# Patient Record
Sex: Male | Born: 1975 | Race: Black or African American | Hispanic: No | Marital: Married | Smoking: Never smoker
Health system: Southern US, Community
[De-identification: ages and names within clinical notes are randomized; demographics above are authoritative.]

## PROBLEM LIST (undated history)

## (undated) DIAGNOSIS — K219 Gastro-esophageal reflux disease without esophagitis: Secondary | ICD-10-CM

## (undated) DIAGNOSIS — M5430 Sciatica, unspecified side: Secondary | ICD-10-CM

## (undated) DIAGNOSIS — G4733 Obstructive sleep apnea (adult) (pediatric): Secondary | ICD-10-CM

## (undated) HISTORY — DX: Sciatica, unspecified side: M54.30

## (undated) HISTORY — DX: Gastro-esophageal reflux disease without esophagitis: K21.9

## (undated) HISTORY — DX: Obstructive sleep apnea (adult) (pediatric): G47.33

## (undated) HISTORY — PX: ESOPHAGEAL DILATION: SHX303

---

## 1998-06-28 ENCOUNTER — Emergency Department (HOSPITAL_COMMUNITY): Admission: EM | Admit: 1998-06-28 | Discharge: 1998-06-28 | Payer: Self-pay | Admitting: Emergency Medicine

## 1998-12-03 ENCOUNTER — Emergency Department (HOSPITAL_COMMUNITY): Admission: EM | Admit: 1998-12-03 | Discharge: 1998-12-03 | Payer: Self-pay | Admitting: Emergency Medicine

## 1998-12-03 ENCOUNTER — Encounter: Payer: Self-pay | Admitting: Emergency Medicine

## 1999-01-09 ENCOUNTER — Ambulatory Visit (HOSPITAL_COMMUNITY): Admission: RE | Admit: 1999-01-09 | Discharge: 1999-01-09 | Payer: Self-pay | Admitting: Family Medicine

## 1999-01-09 ENCOUNTER — Encounter: Payer: Self-pay | Admitting: Family Medicine

## 1999-01-20 ENCOUNTER — Emergency Department (HOSPITAL_COMMUNITY): Admission: EM | Admit: 1999-01-20 | Discharge: 1999-01-20 | Payer: Self-pay | Admitting: Emergency Medicine

## 1999-01-20 ENCOUNTER — Encounter: Payer: Self-pay | Admitting: Emergency Medicine

## 1999-01-25 ENCOUNTER — Ambulatory Visit (HOSPITAL_COMMUNITY): Admission: RE | Admit: 1999-01-25 | Discharge: 1999-01-25 | Payer: Self-pay | Admitting: Critical Care Medicine

## 1999-01-26 ENCOUNTER — Encounter: Payer: Self-pay | Admitting: Critical Care Medicine

## 1999-01-26 ENCOUNTER — Inpatient Hospital Stay (HOSPITAL_COMMUNITY): Admission: RE | Admit: 1999-01-26 | Discharge: 1999-01-27 | Payer: Self-pay | Admitting: Critical Care Medicine

## 2000-06-18 ENCOUNTER — Encounter: Payer: Self-pay | Admitting: Emergency Medicine

## 2000-06-18 ENCOUNTER — Emergency Department (HOSPITAL_COMMUNITY): Admission: EM | Admit: 2000-06-18 | Discharge: 2000-06-18 | Payer: Self-pay | Admitting: *Deleted

## 2003-08-15 ENCOUNTER — Emergency Department (HOSPITAL_COMMUNITY): Admission: EM | Admit: 2003-08-15 | Discharge: 2003-08-16 | Payer: Self-pay | Admitting: Emergency Medicine

## 2004-06-19 ENCOUNTER — Ambulatory Visit: Payer: Self-pay | Admitting: Family Medicine

## 2004-06-26 ENCOUNTER — Ambulatory Visit: Payer: Self-pay | Admitting: Family Medicine

## 2005-02-12 ENCOUNTER — Ambulatory Visit: Payer: Self-pay | Admitting: Critical Care Medicine

## 2005-03-14 ENCOUNTER — Ambulatory Visit: Payer: Self-pay | Admitting: Internal Medicine

## 2005-06-27 ENCOUNTER — Ambulatory Visit: Payer: Self-pay | Admitting: Family Medicine

## 2006-04-05 ENCOUNTER — Ambulatory Visit: Payer: Self-pay | Admitting: Family Medicine

## 2006-04-23 ENCOUNTER — Ambulatory Visit: Payer: Self-pay | Admitting: Family Medicine

## 2006-07-03 ENCOUNTER — Ambulatory Visit: Payer: Self-pay | Admitting: Family Medicine

## 2006-07-23 ENCOUNTER — Ambulatory Visit: Payer: Self-pay | Admitting: Family Medicine

## 2006-08-09 ENCOUNTER — Ambulatory Visit: Payer: Self-pay | Admitting: Gastroenterology

## 2006-08-22 ENCOUNTER — Ambulatory Visit: Payer: Self-pay | Admitting: Gastroenterology

## 2006-08-22 ENCOUNTER — Encounter (INDEPENDENT_AMBULATORY_CARE_PROVIDER_SITE_OTHER): Payer: Self-pay | Admitting: *Deleted

## 2006-09-10 ENCOUNTER — Encounter: Payer: Self-pay | Admitting: Adult Health

## 2006-09-10 ENCOUNTER — Ambulatory Visit: Payer: Self-pay | Admitting: Internal Medicine

## 2006-09-24 ENCOUNTER — Ambulatory Visit: Payer: Self-pay | Admitting: Gastroenterology

## 2006-11-11 ENCOUNTER — Ambulatory Visit: Payer: Self-pay | Admitting: Critical Care Medicine

## 2006-11-11 ENCOUNTER — Encounter: Payer: Self-pay | Admitting: Internal Medicine

## 2007-05-16 ENCOUNTER — Ambulatory Visit: Payer: Self-pay | Admitting: Family Medicine

## 2007-05-19 DIAGNOSIS — J45909 Unspecified asthma, uncomplicated: Secondary | ICD-10-CM | POA: Insufficient documentation

## 2007-05-20 LAB — CONVERTED CEMR LAB
AST: 22 units/L (ref 0–37)
Albumin: 4.2 g/dL (ref 3.5–5.2)
Alkaline Phosphatase: 110 units/L (ref 39–117)
BUN: 8 mg/dL (ref 6–23)
Basophils Absolute: 0 10*3/uL (ref 0.0–0.1)
Basophils Relative: 0 % (ref 0.0–1.0)
CO2: 33 meq/L — ABNORMAL HIGH (ref 19–32)
Chloride: 105 meq/L (ref 96–112)
Cholesterol: 142 mg/dL (ref 0–200)
Creatinine, Ser: 1.1 mg/dL (ref 0.4–1.5)
HCT: 43.8 % (ref 39.0–52.0)
Hemoglobin: 15.3 g/dL (ref 13.0–17.0)
LDL Cholesterol: 90 mg/dL (ref 0–99)
MCHC: 34.9 g/dL (ref 30.0–36.0)
Monocytes Absolute: 0.4 10*3/uL (ref 0.2–0.7)
Monocytes Relative: 8 % (ref 3.0–11.0)
Neutrophils Relative %: 53.5 % (ref 43.0–77.0)
RBC: 4.86 M/uL (ref 4.22–5.81)
RDW: 12.1 % (ref 11.5–14.6)
Total Bilirubin: 0.9 mg/dL (ref 0.3–1.2)
Total CHOL/HDL Ratio: 3.5
Total Protein: 8.1 g/dL (ref 6.0–8.3)
VLDL: 12 mg/dL (ref 0–40)

## 2007-05-26 ENCOUNTER — Ambulatory Visit: Payer: Self-pay | Admitting: Family Medicine

## 2007-05-26 DIAGNOSIS — K219 Gastro-esophageal reflux disease without esophagitis: Secondary | ICD-10-CM | POA: Insufficient documentation

## 2007-09-26 ENCOUNTER — Ambulatory Visit: Payer: Self-pay | Admitting: Family Medicine

## 2007-11-20 ENCOUNTER — Encounter: Payer: Self-pay | Admitting: Family Medicine

## 2008-01-12 ENCOUNTER — Ambulatory Visit: Payer: Self-pay | Admitting: Critical Care Medicine

## 2008-01-13 ENCOUNTER — Encounter: Payer: Self-pay | Admitting: Critical Care Medicine

## 2008-03-25 ENCOUNTER — Ambulatory Visit: Payer: Self-pay | Admitting: Family Medicine

## 2008-04-29 ENCOUNTER — Encounter: Payer: Self-pay | Admitting: Family Medicine

## 2008-09-27 ENCOUNTER — Ambulatory Visit: Payer: Self-pay | Admitting: Family Medicine

## 2008-09-27 LAB — CONVERTED CEMR LAB
Nitrite: NEGATIVE
Protein, U semiquant: NEGATIVE
Urobilinogen, UA: 0.2
WBC Urine, dipstick: NEGATIVE

## 2008-10-01 LAB — CONVERTED CEMR LAB
ALT: 18 units/L (ref 0–53)
AST: 21 units/L (ref 0–37)
Albumin: 4 g/dL (ref 3.5–5.2)
Alkaline Phosphatase: 92 units/L (ref 39–117)
BUN: 11 mg/dL (ref 6–23)
CO2: 28 meq/L (ref 19–32)
Chloride: 103 meq/L (ref 96–112)
Eosinophils Absolute: 0.3 10*3/uL (ref 0.0–0.7)
Eosinophils Relative: 4.6 % (ref 0.0–5.0)
GFR calc non Af Amer: 91 mL/min
HDL: 52.9 mg/dL (ref >39.0)
LDL Cholesterol: 88 mg/dL (ref 0–99)
Lymphocytes Relative: 29.7 % (ref 12.0–46.0)
MCV: 90.6 fL (ref 78.0–100.0)
Monocytes Relative: 6.7 % (ref 3.0–12.0)
Neutrophils Relative %: 58.8 % (ref 43.0–77.0)
Platelets: 234 10*3/uL (ref 150–400)
Potassium: 4.2 meq/L (ref 3.5–5.1)
Total CHOL/HDL Ratio: 2.9
VLDL: 11 mg/dL (ref 0–40)
WBC: 6 10*3/uL (ref 4.5–10.5)

## 2008-10-04 ENCOUNTER — Ambulatory Visit: Payer: Self-pay | Admitting: Family Medicine

## 2008-10-16 ENCOUNTER — Encounter: Payer: Self-pay | Admitting: Family Medicine

## 2008-11-03 ENCOUNTER — Encounter: Admission: RE | Admit: 2008-11-03 | Discharge: 2008-11-03 | Payer: Self-pay | Admitting: Orthopedic Surgery

## 2009-05-20 ENCOUNTER — Ambulatory Visit: Payer: Self-pay | Admitting: Critical Care Medicine

## 2009-11-03 ENCOUNTER — Encounter: Admission: RE | Admit: 2009-11-03 | Discharge: 2009-11-03 | Payer: Self-pay | Admitting: Orthopedic Surgery

## 2010-03-01 ENCOUNTER — Ambulatory Visit: Payer: Self-pay | Admitting: Family Medicine

## 2010-03-23 ENCOUNTER — Emergency Department (HOSPITAL_COMMUNITY): Admission: EM | Admit: 2010-03-23 | Discharge: 2010-03-23 | Payer: Self-pay | Admitting: Family Medicine

## 2010-08-21 ENCOUNTER — Telehealth (INDEPENDENT_AMBULATORY_CARE_PROVIDER_SITE_OTHER): Payer: Self-pay | Admitting: *Deleted

## 2010-09-09 ENCOUNTER — Encounter: Payer: Self-pay | Admitting: Family Medicine

## 2010-09-12 NOTE — Assessment & Plan Note (Signed)
Summary: hamstring pain//ccm   Vital Signs:  Patient profile:   35 year old male Weight:      191 pounds BP sitting:   120 / 88  (left arm) Cuff size:   regular  Vitals Entered By: Raechel Ache, RN (March 01, 2010 4:16 PM) CC: C/o pain back of R thigh x 10 days.   History of Present Illness: Here for 10 days of pain in the back of the right thigh. No recent trauma that he knows of. Never had this before. Using heat and Advil with mixed results. Still able to work but he is having a lot of pain in the hamstrings.   Allergies: 1)  ! * Pepto Bismal  Past History:  Past Medical History: Reviewed history from 03/25/2008 and no changes required. Asthma, sees Dr. Delford Field GERD  Review of Systems  The patient denies anorexia, fever, weight loss, weight gain, vision loss, decreased hearing, hoarseness, chest pain, syncope, dyspnea on exertion, peripheral edema, prolonged cough, headaches, hemoptysis, abdominal pain, melena, hematochezia, severe indigestion/heartburn, hematuria, incontinence, genital sores, muscle weakness, suspicious skin lesions, transient blindness, difficulty walking, depression, unusual weight change, abnormal bleeding, enlarged lymph nodes, angioedema, breast masses, and testicular masses.    Physical Exam  General:  walks with a slight limp Msk:  his right hamstrings are tender but not swollen. Extension of the right leg with the knee straight is painful, not with the knee bent    Impression & Recommendations:  Problem # 1:  CERUMEN IMPACTION (ICD-380.4)  Complete Medication List: 1)  Advair Diskus 250-50 Mcg/dose Aepb (Fluticasone-salmeterol) .Marland Kitchen.. 1 puff two times a day 2)  Diclofenac Sodium 50 Mg Tbec (Diclofenac sodium) .... Three times a day as needed pain  Patient Instructions: 1)  both were irrigated clear with water. 2)  Please schedule a follow-up appointment as needed .  Prescriptions: DICLOFENAC SODIUM 50 MG TBEC (DICLOFENAC SODIUM) three times  a day as needed pain  #60 x 5   Entered and Authorized by:   Nelwyn Salisbury MD   Signed by:   Nelwyn Salisbury MD on 03/01/2010   Method used:   [facility]  [address] [phone]* (retail[facility]       [address]       [place], [place]  [address]       Ph: [phone]       Fax: [phone]   RxID:   [id number]   Appended Document: hamstring pain//ccm     Allergies: 1)  ! * Pepto Bismal   Impression & Recommendations:  Problem # 1:  STRAIN, THIGH (ICD-843.9)  Complete Medication List: 1)  Advair Diskus 250-50 Mcg/dose Aepb (Fluticasone-salmeterol) .Marland Kitchen.. 1 puff two times a day 2)  Diclofenac Sodium 50 Mg Tbec (Diclofenac sodium) .... Three times a day as needed pain  Patient Instructions: 1)  rest, heat, Diclofenac as needed , and gentle stretches. This will take several weeks to heal.  2)  Please schedule a follow-up appointment as needed .   Appended Document: hamstring pain//ccm please note the original record of a cerumen impaction was incorrect. He actually has a hamstring pull as noted in the APPEND above.

## 2010-09-12 NOTE — Letter (Signed)
Summary: Statement of Medical Necessity/ [facility]  Statement of Medical Necessity/ [facility]   Imported By: Lennie Odor 01/02/2010 16:12:29  _____________________________________________________________________  External Attachment:    Type:   Image     Comment:   External Document

## 2010-09-12 NOTE — Miscellaneous (Signed)
Summary: Injection Record / [facility]    Injection Record / [facility]    Imported By: Lennie Odor 01/02/2010 16:11:03  _____________________________________________________________________  External Attachment:    Type:   Image     Comment:   External Document

## 2010-09-14 ENCOUNTER — Ambulatory Visit (INDEPENDENT_AMBULATORY_CARE_PROVIDER_SITE_OTHER): Payer: BC Managed Care – PPO | Admitting: Critical Care Medicine

## 2010-09-14 ENCOUNTER — Ambulatory Visit: Admit: 2010-09-14 | Payer: Self-pay | Admitting: Critical Care Medicine

## 2010-09-14 ENCOUNTER — Encounter: Payer: Self-pay | Admitting: Critical Care Medicine

## 2010-09-14 DIAGNOSIS — G4733 Obstructive sleep apnea (adult) (pediatric): Secondary | ICD-10-CM

## 2010-09-14 DIAGNOSIS — J45909 Unspecified asthma, uncomplicated: Secondary | ICD-10-CM

## 2010-09-14 NOTE — Progress Notes (Signed)
Summary: prescription  Phone Note Call from Patient   Caller: Patient Call For: dr Delford Field Summary of Call: Patient phoned and scheduled  follow up appointment for 09/14/10 at 12:00 but he does not have enough of his Advir 250 to last and wanted to know if [facility] [address] Patient can be reached [phone] Initial call taken by: Vedia Coffer,  August 21, 2010 3:48 PM  Follow-up for Phone Call        Rx refilled x 1 only- [facility] and advised pt this was done and needs to keep followup with PW for refills.  Follow-up by: Vernie Murders,  August 21, 2010 4:24 PM    Prescriptions: ADVAIR DISKUS 250-50 MCG/DOSE AEPB (FLUTICASONE-SALMETEROL) 1 puff two times a day  #1 x 0   Entered by:   Vernie Murders   Authorized by:   Storm Frisk MD   Signed by:   Vernie Murders on 08/21/2010   Method used:   [facility]  [address] [phone]* (retail)       [address]       [place], [place]  [address]       Ph: [phone]       Fax: [phone]   RxID:   [id number]

## 2010-09-20 NOTE — Assessment & Plan Note (Signed)
Summary: Pulmonary OV   Vital Signs:  Patient profile:   35 year old male Height:      71.5 inches Weight:      194.38 pounds BMI:     26.83 O2 Sat:      96 % on Room air Temp:     98.9 degrees F oral Pulse rate:   84 / minute BP sitting:   122 / 84  (right arm) Cuff size:   regular  Vitals Entered By: Gweneth Dimitri RN (September 14, 2010 11:59 AM)  O2 Flow:  Room air CC: Follow up.  Last seen 05/2009.  Pt states overall breathing is going good - does have SOB "periodically."   Mainly when walking a long distance.  Denies wheezing, chest tightness, cough. Comments Medications reviewed with patient Daytime contact number verified with patient. Gweneth Dimitri RN  September 14, 2010 12:01 PM    Primary Provider/Referring Provider:  Dr. Clent Ridges  CC:  Follow up.  Last seen 05/2009.  Pt states overall breathing is going good - does have SOB "periodically."   Mainly when walking a long distance.  Denies wheezing, chest tightness, and cough..  History of Present Illness: Cole Chen is a 35 year old white male who I have not seen since July 2006.  The patient has history of moderate to severe asthma with significant atopic features.  He was seen by Dr. Maple Hudson for allergy evaluation in August 2006, had an Alpha 1-antitrypsin assay checked which was normal.  He had allergy skin testing done with multiple positive skin tests noted.  He had an IgE level obtained, which was elevated at 278.  He maintains Advair 500/50 one spray twice daily and Prilosec 20 mg daily.  last visit 3/08  The pt exhibits fixed severe airflow obstruction.  He is not requiring a rescue inhaler.  There is no cough.  Allergy season was an issue.  No ED visits or hospital visits in the past year.    May 20, 2009 11:01 AM No changes.  No new issues.   Pt denies any significant sore throat, nasal congestion or excess secretions, fever, chills, sweats, unintended weight loss, pleurtic or exertional chest pain,  orthopnea PND, or leg swelling Pt denies any increase in rescue therapy over baseline, denies waking up needing it or having any early am or nocturnal exacerbations of coughing/wheezing/or dyspnea.  September 14, 2010 12:05 PM not seen since 10/10 pt is noting snoring,  no new symptoms. no excess mucus. Pt with ongoing hypersomnolence.  See sleep eval form. Pt denies any significant sore throat, nasal congestion or excess secretions, fever, chills, sweats, unintended weight loss, pleurtic or exertional chest pain, orthopnea PND, or leg swelling Pt denies any increase in rescue therapy over baseline, denies waking up needing it or having any early am or nocturnal exacerbations of coughing/wheezing/or dyspnea.   Asthma History    Asthma Control Assessment:    Age range: 12+ years    Symptoms: 0-2 days/week    Nighttime Awakenings: 0-2/month    Interferes w/ normal activity: no limitations    SABA use (not for EIB): 0-2 days/week    ATAQ questionnaire: 0    Exacerbations requiring oral systemic steroids: 0-1/year    Asthma Control Assessment: Well Controlled   Preventive Screening-Counseling & Management  Alcohol-Tobacco     Smoking Status: never  Current Medications (verified): 1)  Advair Diskus 250-50 Mcg/dose Aepb (Fluticasone-Salmeterol) .Marland Kitchen.. 1 Puff Two Times A Day 2)  Diclofenac Sodium  50 Mg Tbec (Diclofenac Sodium) .... Three Times A Day As Needed Pain  Allergies (verified): 1)  ! * Pepto Bismal  Past History:  Past medical, surgical, family and social histories (including risk factors) reviewed, and no changes noted (except as noted below).  Past Medical History: Asthma, sees Dr. Delford Field GERD Sciatic nerve irritation  Past Surgical History: Reviewed history from 05/26/2007 and no changes required. Panendoscopy dilatation of esophageal stricture 08-22-06 per Dr. Russella Dar  Family History: Reviewed history from 05/19/2007 and no changes required. Family History Diabetes  1st degree relative  Social History: Reviewed history from 05/19/2007 and no changes required. Occupation: Married Never Smoked Alcohol use-no Drug use-no Regular exercise-yes  Review of Systems       The patient complains of shortness of breath with activity.  The patient denies shortness of breath at rest, productive cough, non-productive cough, coughing up blood, chest pain, irregular heartbeats, acid heartburn, indigestion, loss of appetite, weight change, abdominal pain, difficulty swallowing, sore throat, tooth/dental problems, headaches, nasal congestion/difficulty breathing through nose, sneezing, itching, ear ache, anxiety, depression, hand/feet swelling, joint stiffness or pain, rash, change in color of mucus, and fever.    Physical Exam  Additional Exam:  Gen: WD WN    AAM      in NAD    NCAT Heent:  no jvd, no TMG, no cervical LNademopathy, orophyx clear,  nares with clear watery drainage. 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 Patient Level IV ([address]) Sleep Study (Sleep Study)  Problem # 2:  ASTHMA (ICD-493.90)  Severe persistent asthma stable at this time plan: cont  advair to 250/50 one puff twice daily ROV 12 months  His updated medication list for this problem includes:    Advair Diskus 250-50 Mcg/dose Misc (Fluticasone-salmeterol) ..... One puff twice daily    Proair Hfa 108 (90 Base) Mcg/act Aers (Albuterol sulfate) .Marland Kitchen... 1-2 puffs every 4-6 hours as needed  Complete Medication List: 1)  Advair Diskus 250-50 Mcg/dose Aepb (Fluticasone-salmeterol) .Marland Kitchen.. 1  puff two times a day 2)  Diclofenac Sodium 50 Mg Tbec (Diclofenac sodium) .... Three times a day as needed pain  Patient Instructions: 1)  No change in medications 2)  A sleep study will be obtained 3)  I will call with sleep study results, you will likely need a cpap machine  4)  Return in      12    months Prescriptions: ADVAIR DISKUS 250-50 MCG/DOSE AEPB (FLUTICASONE-SALMETEROL) 1 puff two times a day  #1 x 11   Entered and Authorized by:   Storm Frisk MD   Signed by:   Storm Frisk MD on 09/14/2010   Method used:   [facility]  [address] [phone]* (retail)       [address]       [place], [place]  [address]       Ph: [phone]  Fax: [phone]   RxID:   [id number]    Orders Added: 1)  Est. Patient Level IV [96[address]] 2)  Sleep Nadene Rubins Study]      Pulmonary Sleep Consultation Name Cole Chen DOB 1976-03-25 MRN [id number] Date 09/14/2010  History of Present Illness: Will notice not a restful night sleep Notes some forgetfullness   What time do you typically go to bed?(between what hours): 11-1120pm  How long does it take you to fall asleep? right away falls asleep  How many times during the night do you wake up? will awaken frequently at 230 -3am  What time do you get out of bed to start your day? 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 (in pounds): weight is up 8#  Have you ever had a sleep study before?  If yes,when and where: never had a sleep study  Do you currently use CPAP ? If so , at what pressure? not on cpap   Do you wear oxygen at any time? If yes, how many liters per minute? no

## 2010-09-25 ENCOUNTER — Other Ambulatory Visit: Payer: BC Managed Care – PPO | Admitting: Family Medicine

## 2010-09-25 DIAGNOSIS — Z0000 Encounter for general adult medical examination without abnormal findings: Secondary | ICD-10-CM

## 2010-09-25 LAB — POCT URINALYSIS DIPSTICK
Bilirubin, UA: NEGATIVE
Blood, UA: NEGATIVE
Leukocytes, UA: NEGATIVE
Urobilinogen, UA: 0.2
pH, UA: 5.5

## 2010-09-25 LAB — BASIC METABOLIC PANEL
BUN: 11 mg/dL (ref 6–23)
CO2: 28 mEq/L (ref 19–32)
Chloride: 98 mEq/L (ref 96–112)
Creatinine, Ser: 1 mg/dL (ref 0.4–1.5)

## 2010-09-25 LAB — CBC WITH DIFFERENTIAL/PLATELET
Basophils Absolute: 0 10*3/uL (ref 0.0–0.1)
Eosinophils Absolute: 0.2 10*3/uL (ref 0.0–0.7)
MCHC: 34.6 g/dL (ref 30.0–36.0)
MCV: 90.1 fl (ref 78.0–100.0)
Monocytes Absolute: 0.5 10*3/uL (ref 0.1–1.0)
Neutrophils Relative %: 56.3 % (ref 43.0–77.0)
Platelets: 265 10*3/uL (ref 150.0–400.0)

## 2010-09-25 LAB — LIPID PANEL
HDL: 48.6 mg/dL (ref >39.00)
LDL Cholesterol: 96 mg/dL (ref 0–99)
Total CHOL/HDL Ratio: 3
Triglycerides: 51 mg/dL (ref 0.0–149.0)

## 2010-09-25 LAB — HEPATIC FUNCTION PANEL
Bilirubin, Direct: 0.1 mg/dL (ref 0.0–0.3)
Total Bilirubin: 0.5 mg/dL (ref 0.3–1.2)

## 2010-09-26 ENCOUNTER — Telehealth: Payer: Self-pay

## 2010-09-26 NOTE — Telephone Encounter (Signed)
Message copied by Kyung Rudd on Tue Sep 26, 2010 11:57 AM ------      Message from: Dwaine Deter      Created: Mon Sep 25, 2010  3:57 PM       normal

## 2010-09-27 ENCOUNTER — Telehealth: Payer: Self-pay

## 2010-09-27 NOTE — Telephone Encounter (Signed)
Message copied by Kyung Rudd on Wed Sep 27, 2010  4:36 PM ------      Message from: Dwaine Deter      Created: Mon Sep 25, 2010  3:57 PM       normal

## 2010-09-27 NOTE — Telephone Encounter (Signed)
Attempted to call cell phone but voicemail not yet activated. House phone rang constantly with no voicemail

## 2010-09-28 ENCOUNTER — Telehealth: Payer: Self-pay

## 2010-09-28 NOTE — Telephone Encounter (Signed)
Left mess labs normal

## 2010-09-28 NOTE — Telephone Encounter (Signed)
Message copied by Madison Hickman on Thu Sep 28, 2010 10:11 AM ------      Message from: Dwaine Deter      Created: Mon Sep 25, 2010  3:57 PM       normal

## 2010-10-02 ENCOUNTER — Encounter: Payer: Self-pay | Admitting: Family Medicine

## 2010-10-05 ENCOUNTER — Ambulatory Visit (HOSPITAL_BASED_OUTPATIENT_CLINIC_OR_DEPARTMENT_OTHER): Payer: BC Managed Care – PPO | Attending: Critical Care Medicine

## 2010-10-05 DIAGNOSIS — K117 Disturbances of salivary secretion: Secondary | ICD-10-CM | POA: Insufficient documentation

## 2010-10-05 DIAGNOSIS — J45909 Unspecified asthma, uncomplicated: Secondary | ICD-10-CM | POA: Insufficient documentation

## 2010-10-05 DIAGNOSIS — R0989 Other specified symptoms and signs involving the circulatory and respiratory systems: Secondary | ICD-10-CM | POA: Insufficient documentation

## 2010-10-05 DIAGNOSIS — R0609 Other forms of dyspnea: Secondary | ICD-10-CM | POA: Insufficient documentation

## 2010-10-14 DIAGNOSIS — K117 Disturbances of salivary secretion: Secondary | ICD-10-CM

## 2010-10-14 DIAGNOSIS — R0989 Other specified symptoms and signs involving the circulatory and respiratory systems: Secondary | ICD-10-CM

## 2010-10-14 DIAGNOSIS — R0609 Other forms of dyspnea: Secondary | ICD-10-CM

## 2010-10-14 DIAGNOSIS — J45909 Unspecified asthma, uncomplicated: Secondary | ICD-10-CM

## 2010-10-25 ENCOUNTER — Ambulatory Visit (INDEPENDENT_AMBULATORY_CARE_PROVIDER_SITE_OTHER): Payer: BC Managed Care – PPO | Admitting: Family Medicine

## 2010-10-25 ENCOUNTER — Encounter: Payer: Self-pay | Admitting: Family Medicine

## 2010-10-25 VITALS — BP 130/80 | HR 84 | Ht 70.0 in | Wt 190.0 lb

## 2010-10-25 DIAGNOSIS — R202 Paresthesia of skin: Secondary | ICD-10-CM

## 2010-10-25 DIAGNOSIS — R209 Unspecified disturbances of skin sensation: Secondary | ICD-10-CM

## 2010-10-25 DIAGNOSIS — Z0000 Encounter for general adult medical examination without abnormal findings: Secondary | ICD-10-CM

## 2010-10-25 DIAGNOSIS — R200 Anesthesia of skin: Secondary | ICD-10-CM

## 2010-10-25 NOTE — Progress Notes (Signed)
  Subjective:    Patient ID: Cole Chen, male    DOB: 29-Apr-1976, 35 y.o.   MRN: [id number]  HPI 35 yr old male for a cpx. He feels well with one exception. For the past 4 months he has had intermittent mild sharp pains or numbness or tingling in the right calf or the top of the right foot. No swelling at all. No pain with walking on it. No back pains. No hx of trauma. His recent labs were normal.    Review of Systems  Constitutional: Negative.   HENT: Negative.   Eyes: Negative.   Respiratory: Negative.   Cardiovascular: Negative.   Gastrointestinal: Negative.   Genitourinary: Negative.   Musculoskeletal: Negative.   Skin: Negative.   Neurological: Positive for numbness. Negative for dizziness, tremors, seizures, syncope, facial asymmetry, speech difficulty, weakness, light-headedness and headaches.  Hematological: Negative.   Psychiatric/Behavioral: Negative.        Objective:   Physical Exam  Constitutional: He is oriented to person, place, and time. He appears well-developed and well-nourished. No distress.  HENT:  Head: Normocephalic and atraumatic.  Right Ear: External ear normal.  Left Ear: External ear normal.  Nose: Nose normal.  Mouth/Throat: Oropharynx is clear and moist. No oropharyngeal exudate.  Eyes: Conjunctivae and EOM are normal. Pupils are equal, round, and reactive to light. Right eye exhibits no discharge. Left eye exhibits no discharge. No scleral icterus.  Neck: Neck supple. No JVD present. No tracheal deviation present. No thyromegaly present.  Cardiovascular: Normal rate, regular rhythm, normal heart sounds and intact distal pulses.  Exam reveals no gallop and no friction rub.   No murmur heard. Pulmonary/Chest: Effort normal and breath sounds normal. No respiratory distress. He has no wheezes. He has no rales. He exhibits no tenderness.  Abdominal: Soft. Bowel sounds are normal. He exhibits no distension and no mass. There is no tenderness. There is no  rebound and no guarding.  Genitourinary: Testes normal and penis normal. Cremasteric reflex is present. No penile tenderness.  Musculoskeletal: Normal range of motion. He exhibits no edema and no tenderness.       Squeezing the right  lower leg just above the foot reproduces the tingling in the foot . No cords felt in the calf, negative Homans  Lymphadenopathy:    He has no cervical adenopathy.  Neurological: He is alert and oriented to person, place, and time. He has normal reflexes. No cranial nerve deficit. He exhibits normal muscle tone. Coordination normal.  Skin: Skin is warm and dry. No rash noted. He is not diaphoretic. No erythema. No pallor.  Psychiatric: He has a normal mood and affect. His behavior is normal. Judgment and thought content normal.          Assessment & Plan:  He probably has some tarsal tunnel syndrome. Will set up a NCS to assess further

## 2010-11-14 ENCOUNTER — Encounter: Payer: Self-pay | Admitting: Family Medicine

## 2010-11-29 ENCOUNTER — Encounter: Payer: Self-pay | Admitting: Pulmonary Disease

## 2010-12-29 NOTE — Assessment & Plan Note (Signed)
[name]                         GASTROENTEROLOGY OFFICE NOTE   Cole Chen, Cole Chen                        MRN:          [id number]  DATE:09/24/2006                            DOB:          Dec 27, 1975    This is a 35 year old African-American male who I saw on direct referral  for upper endoscopy from Dr. Clent Ridges for a history of dysphagia.  He  underwent endoscopy with esophageal dilation and biopsy on August 22, 2006.  The esophageal biopsies revealed inflammatory changes consistent  with GERD.  Savary dilation was accomplished without difficulty to 17 mm  and the patient has no ongoing gastrointestinal complaints; his  dysphagia has resolved.  His weight is stable, his appetite is good.  He  relates no melena, hematochezia, change in bowel habits, abdominal pain,  chest pain, nausea, vomiting, hematemesis, or weight loss.   FAMILY HISTORY:  Negative for colon cancer, colon polyps, inflammatory  bowel disease.   PAST MEDICAL HISTORY:  Asthma.   CURRENT MEDICATIONS:  1. Advair 500/50 one puff b.i.d.  2. Omeprazole 20 mg p.o. q.a.m.  3. Albuterol p.r.n.   MEDICATION ALLERGIES:  PEPTO-BISMOL.   SOCIAL HISTORY AND REVIEW OF SYSTEMS:  Per the handwritten form.   PHYSICAL EXAMINATION:  GENERAL:  Well-developed, well-nourished, no  acute distress.  VITAL SIGNS:  Height 6 feet, weight 190.2 pounds.  Blood pressure is  108/76, pulse 72 and regular.  HEENT:  Anicteric sclerae, oropharynx clear.  CHEST:  Clear to auscultation bilaterally.  CARDIAC:  Regular rate and rhythm without murmurs appreciated.  ABDOMEN:  Soft, nontender, nondistended, normal active bowel sounds.  No  palpable organomegaly, masses, or hernias.   ASSESSMENT AND PLAN:  Gastroesophageal reflux disease complicated by a  peptic stricture.  History of asthma possibly related to GERD.  He is to  maintain all standard antireflux measures and omeprazole 20 mg p.o.  q.a.m.  Return  office visit 1 year.     Venita Lick. Russella Dar, MD, [facility]  Electronically Signed   MTS/MedQ  DD: 09/24/2006  DT: 09/24/2006  Job #: [id number]   cc:   Jeannett Senior A. Clent Ridges, MD

## 2010-12-29 NOTE — Assessment & Plan Note (Signed)
[name]                             PULMONARY OFFICE NOTE   ABISHAI, Cole Chen                        MRN:          [id number]  DATE:11/11/2006                            DOB:          08-14-1975    Mr. Cole Chen is a 35 year old white male who I have not seen since July  2006.  The patient has history of moderate to severe asthma with  significant atopic features.  He was seen by Dr. Maple Hudson for allergy  evaluation in August 2006, had an Alpha 1-antitrypsin assay checked  which was normal.  He had allergy skin testing done with multiple  positive skin tests noted.  He had an IgE level obtained, which was  elevated at 278.  He maintains Advair 500/50 one spray twice daily and  Prilosec 20 mg daily.   PHYSICAL EXAMINATION:  VITAL SIGNS:  Temperature 97.9.  Blood pressure  116/80.  Pulse 81.  Saturation was 98 percent on room air.  CHEST:  Completely clear without evidence of wheeze or rhonchi.  CARDIAC:  Regular rate and rhythm without S3.  Normal S1, S2.  ABDOMEN:  Soft, nontender.  EXTREMITIES:  No edema or clubbing.  SKIN:  Clear.   IMPRESSION:  Moderate persistent asthma with significant atopic  features.   PLAN:  Obtain for the patient Xolair.  His dose will be 225 mg every two  weeks subcutaneously.  He will maintain Advair at the 500 level, one  spray twice daily, and we will see the patient back in return followup  in one month.  We will also return the patient back to Dr. Maple Hudson for  further input on his allergy care.     Charlcie Cradle Delford Field, MD, [facility]  Electronically Signed    PEW/MedQ  DD: 11/11/2006  DT: 11/11/2006  Job #: [id number]   cc:   Stacie Acres. Cliffton Asters, M.D.

## 2010-12-29 NOTE — Assessment & Plan Note (Signed)
[facility] OFFICE NOTE   LUM, STILLINGER                        MRN:          [id number]  DATE:04/23/2006                            DOB:          12-Apr-1976    This is a 35 year old gentleman here for a complete physical exam.  He is  doing quite well and has no complaints at all.  He has a history of asthma  but has not used his albuterol in the last couple of years.  We have not  seen him in almost 2 years because his insurance lapsed for awhile and now  he has it back.  He is exercising regular.   For other details of his past medical history, family history, social  history, habits, etc., refer to his last physical note dated June 26, 2004.   ALLERGIES:  PEPTO BISMOL.   CURRENT MEDICATIONS:  1. Advair 500/50 mcg one puff b.i.d.  2. Albuterol inhaler as needed.   OBJECTIVE:  VITAL SIGNS:  Height 6 feet 0 inches, weight 191.  BP 110/80,  pulse 70 and regular.  GENERAL:  He appears to be doing quite well.  SKIN:  Free of significant lesions.  HEENT:  Eyes clear.  Sclerae and pharynx clear.  NECK:  Supple without lymphadenopathy or masses.  LUNGS:  Clear.  CARDIAC:  Rate and rhythm regular without gallops, murmurs or rubs.  Distal  pulses full.  ABDOMEN:  Soft, normal bowel sounds, nontender, no masses.  GENITALIA:  Normal male.  EXTREMITIES:  No clubbing, cyanosis, or edema.  NEUROLOGIC:  Grossly intact.   He was here for fasting labs on August 24.  These were all within normal  limits.   ASSESSMENT AND PLAN:  1. Complete physical.  Will plan to repeat this on a yearly basis.  2. Asthma.  Well-controlled.                                   Tera Mater. Clent Ridges, MD   SAF/MedQ  DD:  04/23/2006  DT:  04/24/2006  Job #:  [id number]

## 2010-12-29 NOTE — Assessment & Plan Note (Signed)
[facility]                             PULMONARY OFFICE NOTE   Cole Chen, Cole Chen                        MRN:          [id number]  DATE:09/10/2006                            DOB:          [birth year]    HISTORY OF PRESENT ILLNESS:  The patient is a 35 year old  African/American male, a patient of Dr. Charlcie Cradle. Wright's, with a  known history of severe persistent asthma with a fixed air flow  obstruction.  He presents today for a routine office visit.  The patient  has not been seen in the office in greater than one year.  The patient  is maintained on Advair 500/50 mg b.i.d.  The patient reports that he  has been doing very well.  He has had no flare in his symptoms.  He has  rare albuterol use.  He exercises on average three times a week, a  combination of weight lifting and treadmill.  The patient reports that  he continues to not be able to play basketball and has not played for  greater than two years, due to his shortness of breath with significant  aerobic activity.  The patient denies any chest pain, palpitations,  orthopnea, PND or leg swelling. Denies any post-nasal drip or overt  reflux symptoms.  The patient was seen for an allergy consultation in  August 2006, by Dr. Joni Fears D. Young.  The patient had a strongly  positive skin test and he did undergo an alpha 1 antitrypsin level which  was negative and an IgE level that was elevated at 278.  Unfortunately,  the patient did not see Dr. Maple Hudson on followup, due to a scheduling  conflict by history.  The patient also is being seen by Dr. Judie Petit T.  Russella Dar and recently underwent an endoscopy on August 22, 2006, for  reflux symptoms with endoscopy showing squamous mucosa with marked and  chronic inflammation, including abundant eosinophils.  The patient is  maintained on Prilosec daily.  The patient has previously undergone a  bronchoscopy in June 2000, which showed severe airway  inflammation.   PAST MEDICAL HISTORY:  Is reviewed.   CURRENT MEDICATIONS:  Are reviewed.   PHYSICAL EXAMINATION:  GENERAL:  The patient is a pleasant male, in no  acute distress.  VITAL SIGNS:  He is afebrile with stable vital signs.  O2 saturation is  97% on room air.  HEENT:  Is unremarkable.  NECK:  Supple without adenopathy.  No jugular venous distention.  LUNGS:  Sounds are clear to auscultation bilaterally without any wheezes  or crackles.  HEART:  S1, S2 without murmur, rub or gallop.  ABDOMEN:  Soft, nontender.  EXTREMITIES:  Warm without any calf tenderness, cyanosis, clubbing or  edema.   DATA:  FEV-I of 1.24 which is 29% of predicted.  This is decreased from  three years ago when it was 33% predicted.   IMPRESSION/PLAN:  Severe persistent asthma with a fixed air flow  obstruction.  The patient has previously been evaluated by Dr. Maple Hudson  with a positive skin test and also an elevated IgE  level.  He will be  referred back to Dr. Maple Hudson to discuss these results and possible  candidate for allergy vaccines and/or Xolair  treatment.  The patient  will continue on Advair 500/50 mg b.i.d.  The patient will return back with Dr. Delford Field in two to three months, or  sooner if needed.  The patient will be made an appointment with Dr.  Maple Hudson in the next two weeks.      Rubye Oaks, NP  Electronically Signed      Charlcie Cradle Delford Field, MD, [facility]  Electronically Signed   TP/MedQ  DD: 09/10/2006  DT: 09/10/2006  Job #: [id number]

## 2011-07-06 ENCOUNTER — Ambulatory Visit (INDEPENDENT_AMBULATORY_CARE_PROVIDER_SITE_OTHER): Payer: BC Managed Care – PPO | Admitting: Family Medicine

## 2011-07-06 DIAGNOSIS — Z23 Encounter for immunization: Secondary | ICD-10-CM

## 2011-09-27 ENCOUNTER — Other Ambulatory Visit (INDEPENDENT_AMBULATORY_CARE_PROVIDER_SITE_OTHER): Payer: BC Managed Care – PPO

## 2011-09-27 DIAGNOSIS — Z0000 Encounter for general adult medical examination without abnormal findings: Secondary | ICD-10-CM

## 2011-09-27 LAB — LIPID PANEL
Cholesterol: 158 mg/dL (ref 0–200)
HDL: 53.7 mg/dL (ref >39.00)
LDL Cholesterol: 89 mg/dL (ref 0–99)
Triglycerides: 77 mg/dL (ref 0.0–149.0)
VLDL: 15.4 mg/dL (ref 0.0–40.0)

## 2011-09-27 LAB — POCT URINALYSIS DIPSTICK
Blood, UA: NEGATIVE
Ketones, UA: NEGATIVE
Protein, UA: NEGATIVE
Spec Grav, UA: 1.025
Urobilinogen, UA: 0.2

## 2011-09-27 LAB — CBC WITH DIFFERENTIAL/PLATELET
Basophils Absolute: 0 10*3/uL (ref 0.0–0.1)
Eosinophils Absolute: 0.3 10*3/uL (ref 0.0–0.7)
HCT: 42.6 % (ref 39.0–52.0)
Lymphs Abs: 1.6 10*3/uL (ref 0.7–4.0)
MCHC: 33.7 g/dL (ref 30.0–36.0)
Monocytes Absolute: 0.5 10*3/uL (ref 0.1–1.0)
Monocytes Relative: 8.1 % (ref 3.0–12.0)
Platelets: 255 10*3/uL (ref 150.0–400.0)
RDW: 13.3 % (ref 11.5–14.6)

## 2011-09-27 LAB — BASIC METABOLIC PANEL
BUN: 12 mg/dL (ref 6–23)
GFR: 101.95 mL/min (ref >60.00)
Glucose, Bld: 84 mg/dL (ref 70–99)
Potassium: 4.2 mEq/L (ref 3.5–5.1)

## 2011-09-27 LAB — HEPATIC FUNCTION PANEL: Total Bilirubin: 0.6 mg/dL (ref 0.3–1.2)

## 2011-09-28 NOTE — Progress Notes (Signed)
Quick Note:  Left a message for pt to return call. ______ 

## 2011-09-28 NOTE — Progress Notes (Signed)
Quick Note:  Left voice message, pt is coming in next week for a CPE. ______

## 2011-10-03 ENCOUNTER — Encounter: Payer: Self-pay | Admitting: Family Medicine

## 2011-10-04 ENCOUNTER — Encounter: Payer: Self-pay | Admitting: Family Medicine

## 2011-10-04 ENCOUNTER — Ambulatory Visit (INDEPENDENT_AMBULATORY_CARE_PROVIDER_SITE_OTHER): Payer: BC Managed Care – PPO | Admitting: Family Medicine

## 2011-10-04 VITALS — BP 122/84 | HR 89 | Temp 98.6°F | Ht 71.0 in | Wt 192.0 lb

## 2011-10-04 DIAGNOSIS — Z0000 Encounter for general adult medical examination without abnormal findings: Secondary | ICD-10-CM

## 2011-10-04 DIAGNOSIS — L639 Alopecia areata, unspecified: Secondary | ICD-10-CM

## 2011-10-04 MED ORDER — FLUTICASONE-SALMETEROL 250-50 MCG/DOSE IN AEPB: 1.0000 | INHALATION_SPRAY | Freq: Two times a day (BID) | RESPIRATORY_TRACT | Status: DC

## 2011-10-04 NOTE — Progress Notes (Signed)
  Subjective:    Patient ID: Cole Chen, male    DOB: Feb 27, 1976, 36 y.o.   MRN: [id number]  HPI 36 yr old male for a cpx. He feels fine but has one issue to discuss. About 3 months ago he noticed a bald patch on his scalp over the right ear. This appeared suddenly and has been slowly enlarging ever since. No itching or other symptoms. He is seeing a chiropractor for his low back pain and sciatica, and this is helping somewhat. His asthma is well controlled, and he has not had to use his rescue inhaler for several years now.    Review of Systems  Constitutional: Negative.   HENT: Negative.   Eyes: Negative.   Respiratory: Negative.   Cardiovascular: Negative.   Gastrointestinal: Negative.   Genitourinary: Negative.   Musculoskeletal: Negative.   Skin: Negative.   Neurological: Negative.   Hematological: Negative.   Psychiatric/Behavioral: Negative.        Objective:   Physical Exam  Constitutional: He is oriented to person, place, and time. He appears well-developed and well-nourished. No distress.  HENT:  Head: Normocephalic and atraumatic.  Right Ear: External ear normal.  Left Ear: External ear normal.  Nose: Nose normal.  Mouth/Throat: Oropharynx is clear and moist. No oropharyngeal exudate.  Eyes: Conjunctivae and EOM are normal. Pupils are equal, round, and reactive to light. Right eye exhibits no discharge. Left eye exhibits no discharge. No scleral icterus.  Neck: Neck supple. No JVD present. No tracheal deviation present. No thyromegaly present.  Cardiovascular: Normal rate, regular rhythm, normal heart sounds and intact distal pulses.  Exam reveals no gallop and no friction rub.   No murmur heard. Pulmonary/Chest: Effort normal and breath sounds normal. No respiratory distress. He has no wheezes. He has no rales. He exhibits no tenderness.  Abdominal: Soft. Bowel sounds are normal. He exhibits no distension and no mass. There is no tenderness. There is no rebound and  no guarding.  Genitourinary: Rectum normal, prostate normal and penis normal. Guaiac negative stool. No penile tenderness.  Musculoskeletal: Normal range of motion. He exhibits no edema and no tenderness.  Lymphadenopathy:    He has no cervical adenopathy.  Neurological: He is alert and oriented to person, place, and time. He has normal reflexes. No cranial nerve deficit. He exhibits normal muscle tone. Coordination normal.  Skin: Skin is warm and dry. No rash noted. He is not diaphoretic. No erythema. No pallor.       There is a well defined round patch of smooth scalp above the right ear which is about 2 cm across. No rash is seen, and no hair shafts are visible  Psychiatric: He has a normal mood and affect. His behavior is normal. Judgment and thought content normal.          Assessment & Plan:  Well exam. We will refer to Dermatology for the alopecia areata.

## 2012-11-12 ENCOUNTER — Other Ambulatory Visit: Payer: Self-pay | Admitting: Family Medicine

## 2013-07-20 ENCOUNTER — Telehealth: Payer: Self-pay | Admitting: Family Medicine

## 2013-07-20 NOTE — Telephone Encounter (Signed)
Refill request for Advair and [facility], looks like the pt has not been seen in awhile. Can we refill this?

## 2013-07-21 ENCOUNTER — Other Ambulatory Visit: Payer: Self-pay | Admitting: Family Medicine

## 2013-07-22 NOTE — Telephone Encounter (Signed)
Per Dr. Clent Ridges, okay to send script and I did send e-scribe.

## 2013-10-02 ENCOUNTER — Ambulatory Visit (INDEPENDENT_AMBULATORY_CARE_PROVIDER_SITE_OTHER): Payer: BC Managed Care – PPO | Admitting: Family Medicine

## 2013-10-02 VITALS — BP 122/86 | HR 98 | Temp 98.1°F | Resp 16 | Ht 71.0 in | Wt 185.8 lb

## 2013-10-02 DIAGNOSIS — J111 Influenza due to unidentified influenza virus with other respiratory manifestations: Secondary | ICD-10-CM

## 2013-10-02 DIAGNOSIS — R059 Cough, unspecified: Secondary | ICD-10-CM

## 2013-10-02 DIAGNOSIS — R05 Cough: Secondary | ICD-10-CM

## 2013-10-02 DIAGNOSIS — J029 Acute pharyngitis, unspecified: Secondary | ICD-10-CM

## 2013-10-02 MED ORDER — HYDROCODONE-HOMATROPINE 5-1.5 MG/5ML PO SYRP: 5.0000 mL | ORAL_SOLUTION | ORAL | Status: DC | PRN

## 2013-10-02 MED ORDER — BENZONATATE 100 MG PO CAPS: 100.0000 mg | ORAL_CAPSULE | Freq: Three times a day (TID) | ORAL | Status: DC

## 2013-10-02 NOTE — Progress Notes (Signed)
Subjective: Patient has been ill for 3 days with cough, congestion, possibly some fever. He missed work on Wednesday and Thursday but went to work today. He is only coughing up a little bit of stuff. Head is mildly congested. He has a headache and mild body aches but not bad. He did have a flu shot this year. He is generally a healthy person, married and 2 children the oldest of whom is off in college.  Objective: Healthy-appearing man who has a cough and doesn't feel well. His TMs are normal. Throat was without erythema. Neck supple without significant nodes. Chest is clear to auscultation. Heart regular without murmurs.  Assessment:  Clinical influenza Cough Congestion  Plan: If he develops wheezing, with his history of asthma, he is to use his inhaler. Otherwise treat symptomatically with Hycodan Tessalon and rest fluids. He can stay off work Monday if he is still sick, but I suspect she will feel better by then. Cautioned him coughing may last for long time. Return as needed

## 2013-10-02 NOTE — Patient Instructions (Signed)
Drink plenty of fluids and get enough rest  Take the cough syrup 1 teaspoon every 4-6 hours as needed for cough. It may sedate you.  Take the cough pills one or 2 every 6-8 hours as needed for cough  In the event of getting acutely worse please return  If you're he is congested her you feel like to have a lot of postnasal drainage you can take some Claritin-D or Allegra-D or Zyrtec-D also.  Take Tylenol or ibuprofen as needed for pain or fever

## 2013-12-03 ENCOUNTER — Other Ambulatory Visit (INDEPENDENT_AMBULATORY_CARE_PROVIDER_SITE_OTHER): Payer: BC Managed Care – PPO

## 2013-12-03 DIAGNOSIS — Z0000 Encounter for general adult medical examination without abnormal findings: Secondary | ICD-10-CM

## 2013-12-03 LAB — CBC WITH DIFFERENTIAL/PLATELET
Basophils Absolute: 0 10*3/uL (ref 0.0–0.1)
Basophils Relative: 0.3 % (ref 0.0–3.0)
EOS PCT: 6.9 % — AB (ref 0.0–5.0)
Eosinophils Absolute: 0.5 10*3/uL (ref 0.0–0.7)
HEMATOCRIT: 43.1 % (ref 39.0–52.0)
Hemoglobin: 14.5 g/dL (ref 13.0–17.0)
LYMPHS ABS: 1.8 10*3/uL (ref 0.7–4.0)
Lymphocytes Relative: 26.7 % (ref 12.0–46.0)
MCHC: 33.6 g/dL (ref 30.0–36.0)
MCV: 90.6 fl (ref 78.0–100.0)
MONOS PCT: 7.3 % (ref 3.0–12.0)
Monocytes Absolute: 0.5 10*3/uL (ref 0.1–1.0)
Neutro Abs: 4 10*3/uL (ref 1.4–7.7)
Neutrophils Relative %: 58.8 % (ref 43.0–77.0)
PLATELETS: 308 10*3/uL (ref 150.0–400.0)
RBC: 4.76 Mil/uL (ref 4.22–5.81)
RDW: 13.6 % (ref 11.5–14.6)
WBC: 6.8 10*3/uL (ref 4.5–10.5)

## 2013-12-03 LAB — POCT URINALYSIS DIPSTICK
Bilirubin, UA: NEGATIVE
Glucose, UA: NEGATIVE
Ketones, UA: NEGATIVE
Leukocytes, UA: NEGATIVE
Nitrite, UA: NEGATIVE
PROTEIN UA: NEGATIVE
SPEC GRAV UA: 1.02
UROBILINOGEN UA: 0.2
pH, UA: 6

## 2013-12-03 LAB — LIPID PANEL
CHOLESTEROL: 147 mg/dL (ref 0–200)
HDL: 44.8 mg/dL (ref >39.00)
LDL CALC: 83 mg/dL (ref 0–99)
TRIGLYCERIDES: 94 mg/dL (ref 0.0–149.0)
Total CHOL/HDL Ratio: 3
VLDL: 18.8 mg/dL (ref 0.0–40.0)

## 2013-12-03 LAB — BASIC METABOLIC PANEL
BUN: 12 mg/dL (ref 6–23)
CO2: 28 mEq/L (ref 19–32)
Calcium: 9.1 mg/dL (ref 8.4–10.5)
Chloride: 103 mEq/L (ref 96–112)
Creatinine, Ser: 0.9 mg/dL (ref 0.4–1.5)
GFR: 118.87 mL/min (ref >60.00)
Glucose, Bld: 82 mg/dL (ref 70–99)
POTASSIUM: 4.3 meq/L (ref 3.5–5.1)
SODIUM: 138 meq/L (ref 135–145)

## 2013-12-03 LAB — HEPATIC FUNCTION PANEL
ALBUMIN: 3.8 g/dL (ref 3.5–5.2)
ALT: 25 U/L (ref 0–53)
AST: 22 U/L (ref 0–37)
Alkaline Phosphatase: 118 U/L — ABNORMAL HIGH (ref 39–117)
Bilirubin, Direct: 0 mg/dL (ref 0.0–0.3)
Total Bilirubin: 0.5 mg/dL (ref 0.3–1.2)
Total Protein: 7.7 g/dL (ref 6.0–8.3)

## 2013-12-03 LAB — TSH: TSH: 0.58 u[IU]/mL (ref 0.35–5.50)

## 2013-12-09 ENCOUNTER — Ambulatory Visit (INDEPENDENT_AMBULATORY_CARE_PROVIDER_SITE_OTHER): Payer: BC Managed Care – PPO | Admitting: Family Medicine

## 2013-12-09 ENCOUNTER — Encounter: Payer: Self-pay | Admitting: Family Medicine

## 2013-12-09 VITALS — BP 129/84 | HR 76 | Temp 98.5°F | Ht 71.0 in | Wt 190.0 lb

## 2013-12-09 DIAGNOSIS — Z0000 Encounter for general adult medical examination without abnormal findings: Secondary | ICD-10-CM

## 2013-12-09 NOTE — Progress Notes (Signed)
Pre visit review using our clinic review tool, if applicable. No additional management support is needed unless otherwise documented below in the visit note. 

## 2013-12-09 NOTE — Progress Notes (Signed)
   Subjective:    Patient ID: Cole Chen, male    DOB: June 25, 1976, 38 y.o.   MRN: [id number]  HPI 38 yr old male for a cpx. He is doing well.    Review of Systems  Constitutional: Negative.   HENT: Negative.   Eyes: Negative.   Respiratory: Negative.   Cardiovascular: Negative.   Gastrointestinal: Negative.   Genitourinary: Negative.   Musculoskeletal: Negative.   Skin: Negative.   Neurological: Negative.   Psychiatric/Behavioral: Negative.        Objective:   Physical Exam  Constitutional: He is oriented to person, place, and time. He appears well-developed and well-nourished. No distress.  HENT:  Head: Normocephalic and atraumatic.  Right Ear: External ear normal.  Left Ear: External ear normal.  Nose: Nose normal.  Mouth/Throat: Oropharynx is clear and moist. No oropharyngeal exudate.  Eyes: Conjunctivae and EOM are normal. Pupils are equal, round, and reactive to light. Right eye exhibits no discharge. Left eye exhibits no discharge. No scleral icterus.  Neck: Neck supple. No JVD present. No tracheal deviation present. No thyromegaly present.  Cardiovascular: Normal rate, regular rhythm, normal heart sounds and intact distal pulses.  Exam reveals no gallop and no friction rub.   No murmur heard. Pulmonary/Chest: Effort normal and breath sounds normal. No respiratory distress. He has no wheezes. He has no rales. He exhibits no tenderness.  Abdominal: Soft. Bowel sounds are normal. He exhibits no distension and no mass. There is no tenderness. There is no rebound and no guarding.  Genitourinary: Rectum normal, prostate normal and penis normal. Guaiac negative stool. No penile tenderness.  Musculoskeletal: Normal range of motion. He exhibits no edema and no tenderness.  Lymphadenopathy:    He has no cervical adenopathy.  Neurological: He is alert and oriented to person, place, and time. He has normal reflexes. No cranial nerve deficit. He exhibits normal muscle tone.  Coordination normal.  Skin: Skin is warm and dry. No rash noted. He is not diaphoretic. No erythema. No pallor.  Psychiatric: He has a normal mood and affect. His behavior is normal. Judgment and thought content normal.          Assessment & Plan:  Well exam.

## 2014-03-03 ENCOUNTER — Encounter: Payer: Self-pay | Admitting: Family Medicine

## 2014-03-03 ENCOUNTER — Ambulatory Visit (INDEPENDENT_AMBULATORY_CARE_PROVIDER_SITE_OTHER): Payer: BC Managed Care – PPO | Admitting: Family Medicine

## 2014-03-03 VITALS — BP 119/73 | HR 80 | Temp 99.7°F | Ht 73.0 in | Wt 193.0 lb

## 2014-03-03 DIAGNOSIS — M25519 Pain in unspecified shoulder: Secondary | ICD-10-CM

## 2014-03-03 DIAGNOSIS — L259 Unspecified contact dermatitis, unspecified cause: Secondary | ICD-10-CM

## 2014-03-03 DIAGNOSIS — M25512 Pain in left shoulder: Secondary | ICD-10-CM

## 2014-03-03 DIAGNOSIS — L309 Dermatitis, unspecified: Secondary | ICD-10-CM

## 2014-03-03 MED ORDER — HALOBETASOL PROPIONATE 0.05 % EX CREA: TOPICAL_CREAM | Freq: Two times a day (BID) | CUTANEOUS | Status: DC

## 2014-03-03 MED ORDER — DICLOFENAC SODIUM 75 MG PO TBEC: 75.0000 mg | DELAYED_RELEASE_TABLET | Freq: Two times a day (BID) | ORAL | Status: DC

## 2014-03-03 NOTE — Progress Notes (Signed)
   Subjective:    Patient ID: Cole Chen, male    DOB: [birth year], 38 y.o.   MRN: [id number]  HPI Here for 2 things. First for one month he has had an itchy rash on the right ankle. This has not spread or changed at all. Also for the last month his left shoulder has had some stiffness and pain. No hx of trauma, but he has been in a bowling league which bowls one night a week. This started in May and will go on for another month. He bowls with his left hand. Motrin helps a little.    Review of Systems  Constitutional: Negative.   Musculoskeletal: Positive for arthralgias.  Skin: Positive for rash.       Objective:   Physical Exam  Constitutional: He appears well-developed and well-nourished.  Musculoskeletal:  Left shoulder has no tenderness at all. ROM is full but he has some pain on full extension  Skin:  2 cm patch of macular scaly pink skin on the right lower leg           Assessment & Plan:  Try Diclofenac and halobetasol cream.

## 2014-03-03 NOTE — Progress Notes (Signed)
Pre visit review using our clinic review tool, if applicable. No additional management support is needed unless otherwise documented below in the visit note. 

## 2014-05-12 ENCOUNTER — Encounter: Payer: Self-pay | Admitting: Family Medicine

## 2014-05-12 ENCOUNTER — Ambulatory Visit (INDEPENDENT_AMBULATORY_CARE_PROVIDER_SITE_OTHER): Payer: BC Managed Care – PPO | Admitting: Family Medicine

## 2014-05-12 VITALS — BP 127/71 | HR 84 | Temp 98.5°F | Ht 73.0 in | Wt 191.0 lb

## 2014-05-12 DIAGNOSIS — Z23 Encounter for immunization: Secondary | ICD-10-CM

## 2014-05-12 DIAGNOSIS — M25512 Pain in left shoulder: Secondary | ICD-10-CM

## 2014-05-12 DIAGNOSIS — M25519 Pain in unspecified shoulder: Secondary | ICD-10-CM

## 2014-05-12 NOTE — Progress Notes (Signed)
Pre visit review using our clinic review tool, if applicable. No additional management support is needed unless otherwise documented below in the visit note. 

## 2014-05-12 NOTE — Progress Notes (Signed)
   Subjective:    Patient ID: Cole Chen, male    DOB: [birth year], 38 y.o.   MRN: [id number]  HPI Here for a one year hx of intermittent stiffness and pain in the upper back and the left shoulder. Using NSAIDs and heat. This has begun to interfere with his activities more and more.    Review of Systems  Constitutional: Negative.   Musculoskeletal: Positive for back pain, neck pain and neck stiffness.       Objective:   Physical Exam  Constitutional: He appears well-developed and well-nourished.  Musculoskeletal:  He has some mild scoliosis of the thoracic spine but ROM is preserved. He is tender along the left side of the neck and the upper back. The left shoulder is within normal limits           Assessment & Plan:  We will refer him to Orthopedics.

## 2014-05-13 ENCOUNTER — Encounter: Payer: Self-pay | Admitting: Family Medicine

## 2014-06-23 ENCOUNTER — Other Ambulatory Visit: Payer: Self-pay | Admitting: Orthopedic Surgery

## 2014-06-23 DIAGNOSIS — M25512 Pain in left shoulder: Secondary | ICD-10-CM

## 2014-06-25 ENCOUNTER — Ambulatory Visit
Admission: RE | Admit: 2014-06-25 | Discharge: 2014-06-25 | Disposition: A | Discharge status: Home / Self Care | Payer: BC Managed Care – PPO | Source: Ambulatory Visit | Attending: Orthopedic Surgery | Admitting: Orthopedic Surgery

## 2014-06-25 DIAGNOSIS — M25512 Pain in left shoulder: Secondary | ICD-10-CM

## 2015-05-17 ENCOUNTER — Other Ambulatory Visit (INDEPENDENT_AMBULATORY_CARE_PROVIDER_SITE_OTHER): Payer: BLUE CROSS/BLUE SHIELD

## 2015-05-17 DIAGNOSIS — Z0000 Encounter for general adult medical examination without abnormal findings: Secondary | ICD-10-CM

## 2015-05-17 LAB — HEPATIC FUNCTION PANEL
ALBUMIN: 4.1 g/dL (ref 3.5–5.2)
ALK PHOS: 118 U/L — AB (ref 39–117)
ALT: 17 U/L (ref 0–53)
AST: 17 U/L (ref 0–37)
BILIRUBIN DIRECT: 0.1 mg/dL (ref 0.0–0.3)
TOTAL PROTEIN: 7.6 g/dL (ref 6.0–8.3)
Total Bilirubin: 0.3 mg/dL (ref 0.2–1.2)

## 2015-05-17 LAB — POCT URINALYSIS DIPSTICK
Bilirubin, UA: NEGATIVE
GLUCOSE UA: NEGATIVE
Ketones, UA: NEGATIVE
Leukocytes, UA: NEGATIVE
Nitrite, UA: NEGATIVE
Protein, UA: NEGATIVE
SPEC GRAV UA: 1.025
UROBILINOGEN UA: 0.2
pH, UA: 6

## 2015-05-17 LAB — CBC WITH DIFFERENTIAL/PLATELET
BASOS ABS: 0 10*3/uL (ref 0.0–0.1)
Basophils Relative: 0.3 % (ref 0.0–3.0)
EOS ABS: 0.4 10*3/uL (ref 0.0–0.7)
Eosinophils Relative: 6.7 % — ABNORMAL HIGH (ref 0.0–5.0)
HCT: 44.6 % (ref 39.0–52.0)
HEMOGLOBIN: 15.1 g/dL (ref 13.0–17.0)
Lymphocytes Relative: 22.4 % (ref 12.0–46.0)
Lymphs Abs: 1.5 10*3/uL (ref 0.7–4.0)
MCHC: 33.9 g/dL (ref 30.0–36.0)
MCV: 89.6 fl (ref 78.0–100.0)
MONO ABS: 0.5 10*3/uL (ref 0.1–1.0)
Monocytes Relative: 8 % (ref 3.0–12.0)
Neutro Abs: 4.1 10*3/uL (ref 1.4–7.7)
Neutrophils Relative %: 62.6 % (ref 43.0–77.0)
Platelets: 287 10*3/uL (ref 150.0–400.0)
RBC: 4.98 Mil/uL (ref 4.22–5.81)
RDW: 13.5 % (ref 11.5–15.5)
WBC: 6.5 10*3/uL (ref 4.0–10.5)

## 2015-05-17 LAB — BASIC METABOLIC PANEL
BUN: 11 mg/dL (ref 6–23)
CALCIUM: 9.6 mg/dL (ref 8.4–10.5)
CO2: 30 mEq/L (ref 19–32)
CREATININE: 1.09 mg/dL (ref 0.40–1.50)
Chloride: 103 mEq/L (ref 96–112)
GFR: 95.82 mL/min (ref >60.00)
Glucose, Bld: 87 mg/dL (ref 70–99)
Potassium: 4.2 mEq/L (ref 3.5–5.1)
Sodium: 139 mEq/L (ref 135–145)

## 2015-05-17 LAB — LIPID PANEL
CHOLESTEROL: 144 mg/dL (ref 0–200)
HDL: 53.2 mg/dL (ref >39.00)
LDL CALC: 76 mg/dL (ref 0–99)
NonHDL: 90.75
TRIGLYCERIDES: 73 mg/dL (ref 0.0–149.0)
Total CHOL/HDL Ratio: 3
VLDL: 14.6 mg/dL (ref 0.0–40.0)

## 2015-05-17 LAB — TSH: TSH: 1.01 u[IU]/mL (ref 0.35–4.50)

## 2015-05-17 LAB — PSA: PSA: 0.87 ng/mL (ref 0.10–4.00)

## 2015-05-24 ENCOUNTER — Encounter: Payer: Self-pay | Admitting: Family Medicine

## 2015-05-24 ENCOUNTER — Ambulatory Visit (INDEPENDENT_AMBULATORY_CARE_PROVIDER_SITE_OTHER): Payer: BLUE CROSS/BLUE SHIELD | Admitting: Family Medicine

## 2015-05-24 VITALS — BP 135/82 | HR 67 | Temp 98.7°F | Ht 73.0 in | Wt 186.0 lb

## 2015-05-24 DIAGNOSIS — Z0000 Encounter for general adult medical examination without abnormal findings: Secondary | ICD-10-CM

## 2015-05-24 DIAGNOSIS — G43909 Migraine, unspecified, not intractable, without status migrainosus: Secondary | ICD-10-CM | POA: Diagnosis not present

## 2015-05-24 DIAGNOSIS — Z23 Encounter for immunization: Secondary | ICD-10-CM | POA: Diagnosis not present

## 2015-05-24 MED ORDER — SUMATRIPTAN SUCCINATE 100 MG PO TABS: 100.0000 mg | ORAL_TABLET | Freq: Once | ORAL | Status: DC

## 2015-05-24 MED ORDER — BUDESONIDE-FORMOTEROL FUMARATE 160-4.5 MCG/ACT IN AERO: 2.0000 | INHALATION_SPRAY | Freq: Two times a day (BID) | RESPIRATORY_TRACT | Status: DC

## 2015-05-24 MED ORDER — ALBUTEROL SULFATE HFA 108 (90 BASE) MCG/ACT IN AERS: 2.0000 | INHALATION_SPRAY | RESPIRATORY_TRACT | Status: DC | PRN

## 2015-05-24 MED ORDER — DICLOFENAC SODIUM 75 MG PO TBEC: 75.0000 mg | DELAYED_RELEASE_TABLET | Freq: Two times a day (BID) | ORAL | Status: DC

## 2015-05-24 NOTE — Progress Notes (Signed)
   Subjective:    Patient ID: Cole Chen, male    DOB: Feb 08, 1976, 39 y.o.   MRN: [id number]  HPI 39 yr old male for a cpx. He has been doing well except for frequent headaches. These started several years ago but he has never mentioned them to me until today. Overt the past 3 months they have become more frequent, averaging 1 to 3 a week. Sometimes he wakes up with them ands sometimes they develop during the day. They are usually centered over the left side of the head. They usually cause mild nausea but he does not vomit. They  make him very sensitive to light and loud noises. They usually go away if he takes some Advil and lies down for an hour. His asthma has been quite stable.   Review of Systems  Constitutional: Negative.   Eyes: Negative.   Respiratory: Negative.   Cardiovascular: Negative.   Gastrointestinal: Negative.   Genitourinary: Negative.   Musculoskeletal: Negative.   Skin: Negative.   Neurological: Positive for headaches. Negative for dizziness, tremors, seizures, syncope, facial asymmetry, speech difficulty, weakness, light-headedness and numbness.  Psychiatric/Behavioral: Negative.        Objective:   Physical Exam  Constitutional: He is oriented to person, place, and time. He appears well-developed and well-nourished. No distress.  HENT:  Head: Normocephalic and atraumatic.  Right Ear: External ear normal.  Left Ear: External ear normal.  Nose: Nose normal.  Mouth/Throat: Oropharynx is clear and moist. No oropharyngeal exudate.  Eyes: Conjunctivae and EOM are normal. Pupils are equal, round, and reactive to light. Right eye exhibits no discharge. Left eye exhibits no discharge. No scleral icterus.  Neck: Neck supple. No JVD present. No tracheal deviation present. No thyromegaly present.  Cardiovascular: Normal rate, regular rhythm, normal heart sounds and intact distal pulses.  Exam reveals no gallop and no friction rub.   No murmur heard. Pulmonary/Chest:  Effort normal and breath sounds normal. No respiratory distress. He has no wheezes. He has no rales. He exhibits no tenderness.  Abdominal: Soft. Bowel sounds are normal. He exhibits no distension and no mass. There is no tenderness. There is no rebound and no guarding.  Genitourinary: Rectum normal, prostate normal and penis normal. Guaiac negative stool. No penile tenderness.  Musculoskeletal: Normal range of motion. He exhibits no edema or tenderness.  Lymphadenopathy:    He has no cervical adenopathy.  Neurological: He is alert and oriented to person, place, and time. He has normal reflexes. No cranial nerve deficit. He exhibits normal muscle tone. Coordination normal.  Skin: Skin is warm and dry. No rash noted. He is not diaphoretic. No erythema. No pallor.  Psychiatric: He has a normal mood and affect. His behavior is normal. Judgment and thought content normal.          Assessment & Plan:  Well exam. We discussed diet and exercise advice. He seems to be having migraine headaches and we discussed the nature and treatment of these. He will try Imitrex 100 mg prn.

## 2015-05-24 NOTE — Progress Notes (Signed)
Pre visit review using our clinic review tool, if applicable. No additional management support is needed unless otherwise documented below in the visit note. 

## 2016-02-01 ENCOUNTER — Telehealth: Payer: Self-pay | Admitting: Gastroenterology

## 2016-02-01 NOTE — Telephone Encounter (Signed)
Pt scheduled to see Amy Esterwood PA 02/10/16@10 :30am. Left message for pt to call back.

## 2016-02-01 NOTE — Telephone Encounter (Signed)
Patient returned phone call. He is aware of 02/10/16 appointment.

## 2016-02-10 ENCOUNTER — Encounter: Payer: Self-pay | Admitting: Physician Assistant

## 2016-02-10 ENCOUNTER — Ambulatory Visit (INDEPENDENT_AMBULATORY_CARE_PROVIDER_SITE_OTHER): Payer: Federal, State, Local not specified - PPO | Admitting: Physician Assistant

## 2016-02-10 VITALS — BP 130/82 | HR 84 | Ht 70.0 in | Wt 197.4 lb

## 2016-02-10 DIAGNOSIS — Z8719 Personal history of other diseases of the digestive system: Secondary | ICD-10-CM | POA: Diagnosis not present

## 2016-02-10 DIAGNOSIS — K219 Gastro-esophageal reflux disease without esophagitis: Secondary | ICD-10-CM | POA: Diagnosis not present

## 2016-02-10 DIAGNOSIS — R1310 Dysphagia, unspecified: Secondary | ICD-10-CM | POA: Diagnosis not present

## 2016-02-10 MED ORDER — PANTOPRAZOLE SODIUM 40 MG PO TBEC: 40.0000 mg | DELAYED_RELEASE_TABLET | Freq: Every day | ORAL | Status: DC

## 2016-02-10 NOTE — Patient Instructions (Signed)
We [facility] [address] 1. Protonix ( pantoprazole sodium ) 40 mg.   You have been scheduled for an endoscopy. Please follow written instructions given to you at your visit today. If you use inhalers (even only as needed), please bring them with you on the day of your procedure. Your physician has requested that you go to www.startemmi.com and enter the access code given to you at your visit today. This web site gives a general overview about your procedure. However, you should still follow specific instructions given to you by our office regarding your preparation for the procedure.

## 2016-02-10 NOTE — Progress Notes (Signed)
Reviewed and agree with management plan.  Aniesha Haughn T. Lanorris Kalisz, MD FACG 

## 2016-02-10 NOTE — Progress Notes (Signed)
Patient ID: Cole Chen, male   DOB: Jun 15, 1976, 40 y.o.   MRN: [id number]   Subjective:    Patient ID: Cole Chen, male    DOB: Jun 15, 1976, 40 y.o.   MRN: [id number]  HPI Cole Chen is Chen pleasant 40 year old African-American male referred today by Dr. Clent Chen for evaluation of dysphagia. He states that he was seen here by Dr. Russella Chen 10 or 11 years ago for similar symptoms and underwent endoscopy with esophageal dilation for Chen stricture. He says he did well for many years but over the past 6 months or so he has had gradual recurrence of solid food dysphagia. He is not having any difficulty with liquids but has had Chen couple of episodes of meat getting stuck. He had an episode last week that required regurgitation. Says he is being very careful with what he is eating and chewing very carefully and drinking liquids along with his meals. He is having almost daily symptoms at this time and frequently has to stop eating to allow the food to go on down. He also has been having daily heartburn and indigestion again worse over the past 4-6 months Frequent nocturnal reflux symptoms as well Appetite has been fine and weight has been stable he is no complaints of abdominal discomfort. He has not been on any chronic PPI. Family history negative for colon cancer.  Review of Systems Pertinent positive and negative review of systems were noted in the above HPI section.  All other review of systems was otherwise negative.  Outpatient Encounter Prescriptions as of 02/10/2016  Medication Sig  . budesonide-formoterol (SYMBICORT) 160-4.5 MCG/ACT inhaler Inhale 2 puffs into the lungs 2 (two) times daily.  . calcium carbonate (TUMS - DOSED IN MG ELEMENTAL CALCIUM) 500 MG chewable tablet Chew 1 tablet by mouth as needed for indigestion or heartburn.  . SUMAtriptan (IMITREX) 100 MG tablet Take 1 tablet (100 mg total) by mouth once. May repeat in 2 hours if headache persists or recurs.  . pantoprazole (PROTONIX) 40 MG tablet Take 1  tablet (40 mg total) by mouth daily.  . [DISCONTINUED] albuterol (VENTOLIN HFA) 108 (90 BASE) MCG/ACT inhaler Inhale 2 puffs into the lungs every 4 (four) hours as needed for wheezing or shortness of breath.  . [DISCONTINUED] diclofenac (VOLTAREN) 75 MG EC tablet Take 1 tablet (75 mg total) by mouth 2 (two) times daily.   No facility-administered encounter medications on file as of 02/10/2016.   Allergies  Allergen Reactions  . Bismuth Subsalicylate     Blisters in mouth   Patient Active Problem List   Diagnosis Date Noted  . Migraines 05/24/2015  . OBSTRUCTIVE SLEEP APNEA 09/14/2010  . Sprain and strain of unspecified site of hip and thigh 09/26/2007  . GERD 05/26/2007  . ASTHMA 05/19/2007   Social History   Social History  . Marital Status: Married    Spouse Name: N/Chen  . Number of Children: N/Chen  . Years of Education: N/Chen   Occupational History  . Not on file.   Social History Main Topics  . Smoking status: Never Smoker   . Smokeless tobacco: Never Used  . Alcohol Use: 0.0 oz/week    0 Standard drinks or equivalent per week     Comment: occasional  . Drug Use: No  . Sexual Activity: Not on file   Other Topics Concern  . Not on file   Social History Narrative    Mr. Spivak's family history includes Cancer in his mother. There is no history of  Diabetes, Colon cancer, Esophageal cancer, Stomach cancer, Pancreatic cancer, Liver disease, Kidney disease, or Heart disease.      Objective:    Filed Vitals:   02/10/16 1029  BP: 130/82  Pulse: 84    Physical Exam  well-developed young African-American male in no acute distress, very pleasant blood pressure 130/82 pulse 84 height 5 foot 10 weight 197 BMI 28.3. HEENT; nontraumatic normocephalic EOMI PERRLA sclera anicteric, Cardiovascular ;regular rate and rhythm with S1-S2 no murmur or gallop, Pulmonary; clear bilaterally, Abdomen ;soft nontender nondistended bowel sounds are active there is no palpable mass or  hepatosplenomegaly, Rectal ;exam not done, Ext; no clubbing cyanosis or edema skin warm and dry, Neuropsych; mood and affect appropriate     Assessment & Plan:   #731 40 year old African-American male with 6 month history of recurrent solid food dysphagia and occasional regurgitation as well as daily heartburn. Patient has history of esophageal stricture which was dilated at least 10 years ago. #2 asthma #3 obstructive sleep apnea  Plan; reviewed in antireflux regimen and patient was given literature. Start Protonix 40 mg by mouth every morning Patient will be scheduled for EGD with esophageal dilation with Dr. Russella Chen. Procedure discussed in detail with the patient including risks and benefits and he is agreeable to proceed. We have requested his records, it appears he had EGD in 2008.Marland Kitchen. Also briefly discussed colon neoplasia surveillance and advised colonoscopy between ages of 4645-50.Marland Kitchen.   [name] PA-C 02/10/2016   Cc: Nelwyn SalisburyFry, Cole A, MD

## 2016-03-07 ENCOUNTER — Encounter: Payer: Self-pay | Admitting: Gastroenterology

## 2016-03-07 ENCOUNTER — Ambulatory Visit (AMBULATORY_SURGERY_CENTER): Payer: Federal, State, Local not specified - PPO | Admitting: Gastroenterology

## 2016-03-07 VITALS — BP 126/84 | HR 79 | Temp 98.6°F | Resp 17 | Ht 70.0 in | Wt 197.0 lb

## 2016-03-07 DIAGNOSIS — R1310 Dysphagia, unspecified: Secondary | ICD-10-CM | POA: Diagnosis present

## 2016-03-07 DIAGNOSIS — K222 Esophageal obstruction: Secondary | ICD-10-CM

## 2016-03-07 DIAGNOSIS — K219 Gastro-esophageal reflux disease without esophagitis: Secondary | ICD-10-CM

## 2016-03-07 HISTORY — PX: ESOPHAGOGASTRODUODENOSCOPY (EGD) WITH ESOPHAGEAL DILATION: SHX5812

## 2016-03-07 MED ORDER — OMEPRAZOLE 40 MG PO CPDR: 40.0000 mg | DELAYED_RELEASE_CAPSULE | Freq: Every day | ORAL | 12 refills | Status: DC

## 2016-03-07 NOTE — Progress Notes (Signed)
No problems noted in the recovery room. maw 

## 2016-03-07 NOTE — Progress Notes (Signed)
Called to room to assist during endoscopic procedure.  Patient ID and intended procedure confirmed with present staff. Received instructions for my participation in the procedure from the performing physician.  

## 2016-03-07 NOTE — Patient Instructions (Signed)
YOU HAD AN ENDOSCOPIC PROCEDURE TODAY AT THE [facility]:   Refer to the procedure report that was given to you for any specific questions about what was found during the examination.  If the procedure report does not answer your questions, please call your gastroenterologist to clarify.  If you requested that your care partner not be given the details of your procedure findings, then the procedure report has been included in a sealed envelope for you to review at your convenience later.  YOU SHOULD EXPECT: Some feelings of bloating in the abdomen. Passage of more gas than usual.  Walking can help get rid of the air that was put into your GI tract during the procedure and reduce the bloating. If you had a lower endoscopy (such as a colonoscopy or flexible sigmoidoscopy) you may notice spotting of blood in your stool or on the toilet paper. If you underwent a bowel prep for your procedure, you may not have a normal bowel movement for a few days.  Please Note:  You might notice some irritation and congestion in your nose or some drainage.  This is from the oxygen used during your procedure.  There is no need for concern and it should clear up in a day or so.  SYMPTOMS TO REPORT IMMEDIATELY:   Following upper endoscopy (EGD)  Vomiting of blood or coffee ground material  New chest pain or pain under the shoulder blades  Painful or persistently difficult swallowing  New shortness of breath  Fever of 100F or higher  Black, tarry-looking stools  For urgent or emergent issues, a gastroenterologist can be reached at any hour by calling [phone].   DIET: Please follow the dilatation diet the rest of today.  Handout was given to your wife.  Drink plenty of fluids but you should avoid alcoholic beverages for 24 hours.  ACTIVITY:  You should plan to take it easy for the rest of today and you should NOT DRIVE or use heavy machinery until tomorrow (because of the sedation medicines used  during the test).    FOLLOW UP: Our staff will call the number listed on your records the next business day following your procedure to check on you and address any questions or concerns that you may have regarding the information given to you following your procedure. If we do not reach you, we will leave a message.  However, if you are feeling well and you are not experiencing any problems, there is no need to return our call.  We will assume that you have returned to your regular daily activities without incident.  If any biopsies were taken you will be contacted by phone or by letter within the next 1-3 weeks.  Please call us at [phone] if you have not heard about the biopsies in 3 weeks.    SIGNATURES/CONFIDENTIALITY: You and/or your care partner have signed paperwork which will be entered into your electronic medical record.  These signatures attest to the fact that that the information above on your After Visit Summary has been reviewed and is understood.  Full responsibility of the confidentiality of this discharge information lies with you and/or your care-partner.    Handouts were given to your care partner on GERD, hiatal hernia, and esophageal dilatation diet to follow the rest of today. Changed protonix to omeprazole 40 mg daily.  Rx was sent to [facility] on [address]. You may resume your other current medications today. Please call if any questions or  concerns.

## 2016-03-07 NOTE — Op Note (Signed)
[facility] Patient Name: Cole Chen Procedure Date: 03/07/2016 2:44 PM MRN: [id number] Endoscopist: Meryl Dare , MD Age: 40 Referring MD:  Date of Birth: 1975/08/23 Gender: Male Account #: [id number] Procedure:                Upper GI endoscopy Indications:              Dysphagia, Gastro-esophageal reflux disease Medicines:                Monitored Anesthesia Care Procedure:                Pre-Anesthesia Assessment:                           - Prior to the procedure, a History and Physical                            was performed, and patient medications and                            allergies were reviewed. The patient's tolerance of                            previous anesthesia was also reviewed. The risks                            and benefits of the procedure and the sedation                            options and risks were discussed with the patient.                            All questions were answered, and informed consent                            was obtained. Prior Anticoagulants: The patient has                            taken no previous anticoagulant or antiplatelet                            agents. ASA Grade Assessment: II - A patient with                            mild systemic disease. After reviewing the risks                            and benefits, the patient was deemed in                            satisfactory condition to undergo the procedure.                           After obtaining informed consent, the endoscope was  passed under direct vision. Throughout the                            procedure, the patient's blood pressure, pulse, and                            oxygen saturations were monitored continuously. The                            Model GIF-[id number] scope was introduced                            through the mouth, and advanced to the second part                            of  duodenum. The upper GI endoscopy was                            accomplished without difficulty. The patient                            tolerated the procedure well. Scope In: Scope Out: Findings:                 One mild benign-appearing, intrinsic stenosis was                            found. This measured 1.2 cm (inner diameter) and                            was traversed. A guidewire was placed and the scope                            was withdrawn. Dilation was performed with a Savary                            dilator with mild resistance at 14 mm. Estimated                            blood loss: none. Dilation was performed with a                            Savary dilator with mild resistance at 15 mm.                            Estimated blood loss: none. Dilation was performed                            with a Savary dilator with mild resistance at 16                            mm. Estimated blood loss was minimal.  The exam of the esophagus was otherwise normal.                           A small hiatal hernia was present.                           The exam of the stomach was otherwise normal.                           A few localized erosions without bleeding were                            found in the duodenal bulb.                           The second portion of the duodenum was normal. Complications:            No immediate complications. Estimated Blood Loss:     Estimated blood loss was minimal. Impression:               - Benign-appearing esophageal stenosis. Dilated.                           - Small hiatal hernia.                           - Duodenal erosions without bleeding.                           - Normal second portion of the duodenum.                           - No specimens collected. Recommendation:           - Patient has a contact number available for                            emergencies. The signs and symptoms of potential                             delayed complications were discussed with the                            patient. Return to normal activities tomorrow.                            Written discharge instructions were provided to the                            patient.                           - Clear liquid diet for 2 hours then advance as                            tolerated to soft diet today. Resume prior diet  tomorrow.                           - Continue present medications including Protonix                            40 mg po qam long term                           - Antireflux measures. Meryl Dare, MD 03/07/2016 3:08:37 PM This report has been signed electronically.

## 2016-03-07 NOTE — Progress Notes (Signed)
To recovery, report to Willis, RN, VSS 

## 2016-03-08 ENCOUNTER — Telehealth: Payer: Self-pay | Admitting: *Deleted

## 2016-03-08 NOTE — Telephone Encounter (Signed)
  Follow up Call-  Call back number 03/07/2016  Post procedure Call Back phone  # [phone]  Permission to leave phone message Yes  Some recent data might be hidden     Patient questions:  Do you have a fever, pain , or abdominal swelling? No. Pain Score  0 *  Have you tolerated food without any problems? Yes.    Have you been able to return to your normal activities? Yes.    Do you have any questions about your discharge instructions: Diet   No. Medications  No. Follow up visit  No.  Do you have questions or concerns about your Care? No.  Actions: * If pain score is 4 or above: No action needed, pain <4.

## 2016-04-09 ENCOUNTER — Ambulatory Visit: Payer: BLUE CROSS/BLUE SHIELD | Admitting: Gastroenterology

## 2016-05-14 DIAGNOSIS — Z111 Encounter for screening for respiratory tuberculosis: Secondary | ICD-10-CM | POA: Diagnosis not present

## 2016-05-14 DIAGNOSIS — Z1159 Encounter for screening for other viral diseases: Secondary | ICD-10-CM | POA: Diagnosis not present

## 2016-05-14 DIAGNOSIS — Z20820 Contact with and (suspected) exposure to varicella: Secondary | ICD-10-CM | POA: Diagnosis not present

## 2016-05-25 ENCOUNTER — Other Ambulatory Visit (INDEPENDENT_AMBULATORY_CARE_PROVIDER_SITE_OTHER): Payer: Federal, State, Local not specified - PPO

## 2016-05-25 DIAGNOSIS — Z0000 Encounter for general adult medical examination without abnormal findings: Secondary | ICD-10-CM

## 2016-05-25 LAB — HEPATIC FUNCTION PANEL
ALBUMIN: 4.3 g/dL (ref 3.5–5.2)
ALK PHOS: 106 U/L (ref 39–117)
ALT: 23 U/L (ref 0–53)
AST: 20 U/L (ref 0–37)
Bilirubin, Direct: 0.1 mg/dL (ref 0.0–0.3)
TOTAL PROTEIN: 7.3 g/dL (ref 6.0–8.3)
Total Bilirubin: 0.5 mg/dL (ref 0.2–1.2)

## 2016-05-25 LAB — BASIC METABOLIC PANEL
BUN: 10 mg/dL (ref 6–23)
CALCIUM: 9.5 mg/dL (ref 8.4–10.5)
CO2: 33 meq/L — AB (ref 19–32)
Chloride: 101 mEq/L (ref 96–112)
Creatinine, Ser: 1.17 mg/dL (ref 0.40–1.50)
GFR: 87.86 mL/min (ref >60.00)
Glucose, Bld: 83 mg/dL (ref 70–99)
POTASSIUM: 4.5 meq/L (ref 3.5–5.1)
SODIUM: 138 meq/L (ref 135–145)

## 2016-05-25 LAB — POC URINALSYSI DIPSTICK (AUTOMATED)
Bilirubin, UA: NEGATIVE
Blood, UA: NEGATIVE
GLUCOSE UA: NEGATIVE
Ketones, UA: NEGATIVE
Leukocytes, UA: NEGATIVE
NITRITE UA: NEGATIVE
Protein, UA: NEGATIVE
SPEC GRAV UA: 1.02
UROBILINOGEN UA: 0.2
pH, UA: 6

## 2016-05-25 LAB — CBC WITH DIFFERENTIAL/PLATELET
BASOS ABS: 0 10*3/uL (ref 0.0–0.1)
Basophils Relative: 0.3 % (ref 0.0–3.0)
Eosinophils Absolute: 0.3 10*3/uL (ref 0.0–0.7)
Eosinophils Relative: 4.8 % (ref 0.0–5.0)
HEMATOCRIT: 44.5 % (ref 39.0–52.0)
Hemoglobin: 15 g/dL (ref 13.0–17.0)
LYMPHS PCT: 34.3 % (ref 12.0–46.0)
Lymphs Abs: 2.3 10*3/uL (ref 0.7–4.0)
MCHC: 33.8 g/dL (ref 30.0–36.0)
MCV: 89.1 fl (ref 78.0–100.0)
MONOS PCT: 8.6 % (ref 3.0–12.0)
Monocytes Absolute: 0.6 10*3/uL (ref 0.1–1.0)
NEUTROS ABS: 3.4 10*3/uL (ref 1.4–7.7)
Neutrophils Relative %: 52 % (ref 43.0–77.0)
PLATELETS: 283 10*3/uL (ref 150.0–400.0)
RBC: 5 Mil/uL (ref 4.22–5.81)
RDW: 13.6 % (ref 11.5–15.5)
WBC: 6.6 10*3/uL (ref 4.0–10.5)

## 2016-05-25 LAB — PSA: PSA: 1.1 ng/mL (ref 0.10–4.00)

## 2016-05-25 LAB — TSH: TSH: 1.36 u[IU]/mL (ref 0.35–4.50)

## 2016-05-31 ENCOUNTER — Ambulatory Visit (INDEPENDENT_AMBULATORY_CARE_PROVIDER_SITE_OTHER): Payer: Federal, State, Local not specified - PPO | Admitting: Family Medicine

## 2016-05-31 ENCOUNTER — Encounter: Payer: Self-pay | Admitting: Family Medicine

## 2016-05-31 VITALS — BP 114/71 | HR 76 | Temp 98.4°F | Ht 70.0 in | Wt 206.0 lb

## 2016-05-31 DIAGNOSIS — Z0000 Encounter for general adult medical examination without abnormal findings: Secondary | ICD-10-CM

## 2016-05-31 DIAGNOSIS — Z23 Encounter for immunization: Secondary | ICD-10-CM

## 2016-05-31 LAB — LIPID PANEL
CHOL/HDL RATIO: 4
CHOLESTEROL: 178 mg/dL (ref 0–200)
HDL: 50.5 mg/dL (ref >39.00)
LDL CALC: 108 mg/dL — AB (ref 0–99)
NonHDL: 127.94
TRIGLYCERIDES: 101 mg/dL (ref 0.0–149.0)
VLDL: 20.2 mg/dL (ref 0.0–40.0)

## 2016-05-31 NOTE — Progress Notes (Signed)
Pre visit review using our clinic review tool, if applicable. No additional management support is needed unless otherwise documented below in the visit note. 

## 2016-05-31 NOTE — Progress Notes (Signed)
   Subjective:    Patient ID: Rosine DoorCalvin Crafts, male    DOB: 1975/08/16, 40 y.o.   MRN: [id number]  HPI 40 yr old male for a well exam. He feels fine. He had an esophageal dilation in July, and since then he has been able to swallow without difficulty and his GERD is controlled.    Review of Systems  Constitutional: Negative.   HENT: Negative.   Eyes: Negative.   Respiratory: Negative.   Cardiovascular: Negative.   Gastrointestinal: Negative.   Genitourinary: Negative.   Musculoskeletal: Negative.   Skin: Negative.   Neurological: Negative.   Psychiatric/Behavioral: Negative.        Objective:   Physical Exam  Constitutional: He is oriented to person, place, and time. He appears well-developed and well-nourished. No distress.  HENT:  Head: Normocephalic and atraumatic.  Right Ear: External ear normal.  Left Ear: External ear normal.  Nose: Nose normal.  Mouth/Throat: Oropharynx is clear and moist. No oropharyngeal exudate.  Eyes: Conjunctivae and EOM are normal. Pupils are equal, round, and reactive to light. Right eye exhibits no discharge. Left eye exhibits no discharge. No scleral icterus.  Neck: Neck supple. No JVD present. No tracheal deviation present. No thyromegaly present.  Cardiovascular: Normal rate, regular rhythm, normal heart sounds and intact distal pulses.  Exam reveals no gallop and no friction rub.   No murmur heard. Pulmonary/Chest: Effort normal and breath sounds normal. No respiratory distress. He has no wheezes. He has no rales. He exhibits no tenderness.  Abdominal: Soft. Bowel sounds are normal. He exhibits no distension and no mass. There is no tenderness. There is no rebound and no guarding.  Genitourinary: Rectum normal, prostate normal and penis normal. Rectal exam shows guaiac negative stool. No penile tenderness.  Musculoskeletal: Normal range of motion. He exhibits no edema or tenderness.  Lymphadenopathy:    He has no cervical adenopathy.    Neurological: He is alert and oriented to person, place, and time. He has normal reflexes. No cranial nerve deficit. He exhibits normal muscle tone. Coordination normal.  Skin: Skin is warm and dry. No rash noted. He is not diaphoretic. No erythema. No pallor.  Psychiatric: He has a normal mood and affect. His behavior is normal. Judgment and thought content normal.          Assessment & Plan:  Well exam. We discussed diet and exercise.  Nelwyn SalisburyFRY,Jerzy Roepke A, MD

## 2016-06-15 DIAGNOSIS — G4733 Obstructive sleep apnea (adult) (pediatric): Secondary | ICD-10-CM | POA: Diagnosis not present

## 2016-08-09 DIAGNOSIS — G4733 Obstructive sleep apnea (adult) (pediatric): Secondary | ICD-10-CM | POA: Diagnosis not present

## 2016-08-22 DIAGNOSIS — G4733 Obstructive sleep apnea (adult) (pediatric): Secondary | ICD-10-CM | POA: Diagnosis not present

## 2016-10-11 DIAGNOSIS — K080 Exfoliation of teeth due to systemic causes: Secondary | ICD-10-CM | POA: Diagnosis not present

## 2016-10-30 DIAGNOSIS — R0689 Other abnormalities of breathing: Secondary | ICD-10-CM | POA: Diagnosis not present

## 2016-11-29 DIAGNOSIS — M25541 Pain in joints of right hand: Secondary | ICD-10-CM | POA: Diagnosis not present

## 2016-11-29 DIAGNOSIS — G43909 Migraine, unspecified, not intractable, without status migrainosus: Secondary | ICD-10-CM | POA: Diagnosis not present

## 2016-11-29 DIAGNOSIS — M7989 Other specified soft tissue disorders: Secondary | ICD-10-CM | POA: Diagnosis not present

## 2016-11-29 DIAGNOSIS — M542 Cervicalgia: Secondary | ICD-10-CM | POA: Diagnosis not present

## 2016-11-29 DIAGNOSIS — G8929 Other chronic pain: Secondary | ICD-10-CM | POA: Diagnosis not present

## 2017-01-08 DIAGNOSIS — M2550 Pain in unspecified joint: Secondary | ICD-10-CM | POA: Diagnosis not present

## 2017-03-13 ENCOUNTER — Other Ambulatory Visit: Payer: Self-pay | Admitting: Gastroenterology

## 2017-03-13 DIAGNOSIS — R1310 Dysphagia, unspecified: Secondary | ICD-10-CM

## 2017-03-13 DIAGNOSIS — K219 Gastro-esophageal reflux disease without esophagitis: Secondary | ICD-10-CM

## 2017-03-13 DIAGNOSIS — K222 Esophageal obstruction: Secondary | ICD-10-CM

## 2017-03-22 DIAGNOSIS — M05741 Rheumatoid arthritis with rheumatoid factor of right hand without organ or systems involvement: Secondary | ICD-10-CM | POA: Diagnosis not present

## 2017-03-22 DIAGNOSIS — M25612 Stiffness of left shoulder, not elsewhere classified: Secondary | ICD-10-CM | POA: Diagnosis not present

## 2017-03-22 DIAGNOSIS — M2548 Effusion, other site: Secondary | ICD-10-CM | POA: Diagnosis not present

## 2017-03-22 DIAGNOSIS — M25812 Other specified joint disorders, left shoulder: Secondary | ICD-10-CM | POA: Diagnosis not present

## 2017-03-22 DIAGNOSIS — M05742 Rheumatoid arthritis with rheumatoid factor of left hand without organ or systems involvement: Secondary | ICD-10-CM | POA: Diagnosis not present

## 2017-03-22 DIAGNOSIS — R7889 Finding of other specified substances, not normally found in blood: Secondary | ICD-10-CM | POA: Diagnosis not present

## 2017-04-08 ENCOUNTER — Other Ambulatory Visit: Payer: Self-pay | Admitting: Gastroenterology

## 2017-04-08 DIAGNOSIS — K219 Gastro-esophageal reflux disease without esophagitis: Secondary | ICD-10-CM

## 2017-04-08 DIAGNOSIS — K222 Esophageal obstruction: Secondary | ICD-10-CM

## 2017-04-08 DIAGNOSIS — R1310 Dysphagia, unspecified: Secondary | ICD-10-CM

## 2017-04-12 DIAGNOSIS — M059 Rheumatoid arthritis with rheumatoid factor, unspecified: Secondary | ICD-10-CM | POA: Diagnosis not present

## 2017-04-29 DIAGNOSIS — M0579 Rheumatoid arthritis with rheumatoid factor of multiple sites without organ or systems involvement: Secondary | ICD-10-CM | POA: Diagnosis not present

## 2017-05-06 DIAGNOSIS — G4733 Obstructive sleep apnea (adult) (pediatric): Secondary | ICD-10-CM | POA: Diagnosis not present

## 2017-05-09 ENCOUNTER — Other Ambulatory Visit: Payer: Self-pay | Admitting: Gastroenterology

## 2017-05-09 DIAGNOSIS — R1310 Dysphagia, unspecified: Secondary | ICD-10-CM

## 2017-05-09 DIAGNOSIS — K222 Esophageal obstruction: Secondary | ICD-10-CM

## 2017-05-09 DIAGNOSIS — K219 Gastro-esophageal reflux disease without esophagitis: Secondary | ICD-10-CM

## 2017-05-12 ENCOUNTER — Other Ambulatory Visit: Payer: Self-pay | Admitting: Gastroenterology

## 2017-05-12 DIAGNOSIS — R1310 Dysphagia, unspecified: Secondary | ICD-10-CM

## 2017-05-12 DIAGNOSIS — K222 Esophageal obstruction: Secondary | ICD-10-CM

## 2017-05-12 DIAGNOSIS — K219 Gastro-esophageal reflux disease without esophagitis: Secondary | ICD-10-CM

## 2017-05-16 DIAGNOSIS — R918 Other nonspecific abnormal finding of lung field: Secondary | ICD-10-CM | POA: Diagnosis not present

## 2017-05-16 DIAGNOSIS — R936 Abnormal findings on diagnostic imaging of limbs: Secondary | ICD-10-CM | POA: Diagnosis not present

## 2017-05-21 ENCOUNTER — Other Ambulatory Visit: Payer: Self-pay | Admitting: Gastroenterology

## 2017-05-21 DIAGNOSIS — K222 Esophageal obstruction: Secondary | ICD-10-CM

## 2017-05-21 DIAGNOSIS — R1310 Dysphagia, unspecified: Secondary | ICD-10-CM

## 2017-05-21 DIAGNOSIS — K219 Gastro-esophageal reflux disease without esophagitis: Secondary | ICD-10-CM

## 2017-05-23 DIAGNOSIS — G4733 Obstructive sleep apnea (adult) (pediatric): Secondary | ICD-10-CM | POA: Diagnosis not present

## 2017-06-17 DIAGNOSIS — G4733 Obstructive sleep apnea (adult) (pediatric): Secondary | ICD-10-CM | POA: Diagnosis not present

## 2017-06-19 ENCOUNTER — Encounter: Payer: Self-pay | Admitting: Pulmonary Disease

## 2017-06-19 ENCOUNTER — Other Ambulatory Visit: Payer: Federal, State, Local not specified - PPO

## 2017-06-19 ENCOUNTER — Ambulatory Visit: Payer: Federal, State, Local not specified - PPO | Admitting: Pulmonary Disease

## 2017-06-19 VITALS — BP 130/70 | HR 96 | Ht 71.0 in | Wt 204.2 lb

## 2017-06-19 DIAGNOSIS — J45909 Unspecified asthma, uncomplicated: Secondary | ICD-10-CM

## 2017-06-19 DIAGNOSIS — R918 Other nonspecific abnormal finding of lung field: Secondary | ICD-10-CM

## 2017-06-19 LAB — NITRIC OXIDE: Nitric Oxide: <5

## 2017-06-19 NOTE — Progress Notes (Deleted)
Subjective:    Patient ID: Cole Chen, male    DOB: 05-20-1976, 41 y.o.   MRN: [id number]  Synopsis: Referred in 2018 for***  HPI No chief complaint on file.  ***  Past Medical History:  Diagnosis Date  . Asthma    Dr Delford FieldWright  . GERD (gastroesophageal reflux disease)   . Sciatic nerve pain    involves right leg, sees Dr. Okey Duprerawford for chiropractic care   . Sleep apnea, obstructive    sees Dr. Delford FieldWright      Family History  Problem Relation Age of Onset  . Cancer Mother        sarcoma  . Diabetes Paternal Grandfather   . Colon cancer Neg Hx   . Esophageal cancer Neg Hx   . Stomach cancer Neg Hx   . Pancreatic cancer Neg Hx   . Liver disease Neg Hx   . Kidney disease Neg Hx   . Heart disease Neg Hx      Social History   Socioeconomic History  . Marital status: Married    Spouse name: Not on file  . Number of children: Not on file  . Years of education: Not on file  . Highest education level: Not on file  Social Needs  . Financial resource strain: Not on file  . Food insecurity - worry: Not on file  . Food insecurity - inability: Not on file  . Transportation needs - medical: Not on file  . Transportation needs - non-medical: Not on file  Occupational History  . Not on file  Tobacco Use  . Smoking status: Never Smoker  . Smokeless tobacco: Never Used  Substance and Sexual Activity  . Alcohol use: Yes    Alcohol/week: 0.0 oz    Comment: occasional  . Drug use: No  . Sexual activity: Not on file  Other Topics Concern  . Not on file  Social History Narrative  . Not on file     Allergies  Allergen Reactions  . Bismuth Subsalicylate     Blisters in mouth     Outpatient Medications Prior to Visit  Medication Sig Dispense Refill  . budesonide-formoterol (SYMBICORT) 160-4.5 MCG/ACT inhaler Inhale 2 puffs into the lungs 2 (two) times daily. 3 Inhaler 3  . calcium carbonate (TUMS - DOSED IN MG ELEMENTAL CALCIUM) 500 MG chewable tablet Chew 1 tablet by  mouth as needed for indigestion or heartburn.    Marland Kitchen. omeprazole (PRILOSEC) 40 MG capsule TAKE ONE CAPSULE BY MOUTH EVERY DAY 30 capsule 0  . omeprazole (PRILOSEC) 40 MG capsule TAKE ONE CAPSULE BY MOUTH EVERY DAY 30 capsule 0  . SUMAtriptan (IMITREX) 100 MG tablet Take 1 tablet (100 mg total) by mouth once. May repeat in 2 hours if headache persists or recurs. 10 tablet 11   No facility-administered medications prior to visit.       Review of Systems     Objective:   Physical Exam There were no vitals filed for this visit.   CBC    Component Value Date/Time   WBC 6.6 05/25/2016 0927   RBC 5.00 05/25/2016 0927   HGB 15.0 05/25/2016 0927   HCT 44.5 05/25/2016 0927   PLT 283.0 05/25/2016 0927   MCV 89.1 05/25/2016 0927   MCHC 33.8 05/25/2016 0927   RDW 13.6 05/25/2016 0927   LYMPHSABS 2.3 05/25/2016 0927   MONOABS 0.6 05/25/2016 0927   EOSABS 0.3 05/25/2016 0927   BASOSABS 0.0 05/25/2016 [id number]    BMET  Component Value Date/Time   NA 138 05/25/2016 0927   K 4.5 05/25/2016 0927   CL 101 05/25/2016 0927   CO2 33 (H) 05/25/2016 0927   GLUCOSE 83 05/25/2016 0927   BUN 10 05/25/2016 0927   CREATININE 1.17 05/25/2016 0927   CALCIUM 9.5 05/25/2016 0927   GFRNONAA 91 09/27/2008 1009   GFRAA 110 09/27/2008 1009        Assessment & Plan:    No diagnosis found.  Discussion: ***    Current Outpatient Medications:  .  budesonide-formoterol (SYMBICORT) 160-4.5 MCG/ACT inhaler, Inhale 2 puffs into the lungs 2 (two) times daily., Disp: 3 Inhaler, Rfl: 3 .  calcium carbonate (TUMS - DOSED IN MG ELEMENTAL CALCIUM) 500 MG chewable tablet, Chew 1 tablet by mouth as needed for indigestion or heartburn., Disp: , Rfl:  .  omeprazole (PRILOSEC) 40 MG capsule, TAKE ONE CAPSULE BY MOUTH EVERY DAY, Disp: 30 capsule, Rfl: 0 .  omeprazole (PRILOSEC) 40 MG capsule, TAKE ONE CAPSULE BY MOUTH EVERY DAY, Disp: 30 capsule, Rfl: 0 .  SUMAtriptan (IMITREX) 100 MG tablet, Take 1 tablet (100  mg total) by mouth once. May repeat in 2 hours if headache persists or recurs., Disp: 10 tablet, Rfl: 11

## 2017-06-19 NOTE — Patient Instructions (Addendum)
Pulmonary nodule: We will arrange for a noncontrast CT scan of the chest to assess this further Assuming there is no growth in the pulmonary nodule we will plan on performing another one in 12 months Let us know if you have any weight loss, chest pain or cough up blood  Asthma: Continue taking Symbicort as you are doing Exhaled nitric oxide test today Spirometry test today   You can have a Prevnar vaccine then or sooner by her primary care doctor.  Follow up with our office in 3 weeks (after CT scan)

## 2017-06-19 NOTE — Progress Notes (Signed)
Subjective:    Patient ID: Cole Chen, male    DOB: 10/14/1975, 41 y.o.   MRN: [id number] Synopsis: Former patient of Dr. Delford FieldWright with severe asthma.  He is a lifetime non-smoker.  Developed asthma after working for a [employer] company in [place].  Has a history of working in the [employer] for [employer].  Also has a history of esophageal stricture and underwent an esophageal dilation in July 2017.  HPI Chief Complaint  Patient presents with  . pulmonary consult    self referred. former [facility] patient. [facility], small nodules were found on lung    Cole Chen was referred to me for evaluation of asthma and pulmonary nodules.  He says that he first learned about the nodule from the [facility] a few weeks ago he had been experiencing some left-sided shoulder pain and arthritis symptoms so he underwent a CT scan as ordered by his rheumatologist at the [facility].  He was found to have 2 small nodules in the apex of his right lung.  Specifically he had a 7 mm nodule and a 0.3 cm nodule which was seen.  This procedure was performed on May 16, 2017.  He tells me that he is a lifelong non-smoker.  He has a history of asthma.  This was discovered by Dr. Delford FieldWright around the year 2000.  Cole Chen had been working in [employer] and then came out into [employer] and was working for a [employer] flav[employer] company in [place].  Not long after working there he developed the sudden onset of worsening shortness of breath.  He first noticed the onset of shortness of breath while exercising.  He says that this progressed and he was evaluated by Dr. Delford FieldWright with a bronchoscopy.  During the bronchoscopy he had severe shortness of breath and had to be admitted to the intensive care unit.  Since then he has been taking Symbicort 2 puffs twice a day.  He says that his symptoms are well controlled and he hardly ever has any symptoms of shortness of breath.  He does exercise from time to time and he will have some shortness of  breath.  However, he says he never needs to use his albuterol inhaler.  He gets a flu shot every year.  He has the pneumonia vacs on record but he has never had the Prevnar vaccine.  He says that his childhood was normal without respiratory problems.  His brother has a history of asthma.  His mother died of sarcoma of the shoulder and his father has prostate cancer.  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   Review of Systems  Constitutional: Negative for activity change, appetite change, chills and fever.  HENT: Negative for congestion, ear pain, hearing loss, postnasal drip, rhinorrhea, sinus pressure and sneezing.   Eyes: Negative for redness, itching and visual disturbance.  Respiratory: Negative for cough, chest tightness, shortness of breath and wheezing.   Cardiovascular: Negative for chest pain, palpitations and leg swelling.  Gastrointestinal: Negative for abdominal distention, abdominal pain, blood in stool, constipation, diarrhea, nausea and vomiting.  Musculoskeletal: Negative for arthralgias, gait problem, joint swelling, myalgias, neck pain and neck stiffness.  Skin: Negative for rash.  Neurological: Negative for dizziness, light-headedness, numbness and headaches.  Hematological: Does not bruise/bleed easily.  Psychiatric/Behavioral: Negative for confusion and dysphoric mood.  Objective:   Physical Exam Vitals:   06/19/17 1521  BP: 130/70  Pulse: 96  SpO2: 98%  Weight: 204 lb 4 oz (92.6 kg)  Height: 5\' 11"  (1.803 m)   Gen: well appearing, no acute distress HENT: NCAT, OP clear, neck supple without masses Eyes: PERRL, EOMi Lymph: no cervical lymphadenopathy PULM: CTA B CV: RRR, no mgr, no JVD GI: BS+, soft, nontender, no hsm Derm: no rash or skin breakdown MSK: normal bulk and tone Neuro: A&Ox4, CN II-XII intact, strength 5/5 in all 4  extremities Psyche: normal mood and affect   Records from in October 2017 visit with primary care reviewed, it was a well visit, no problems at that time.  He had gastroesophageal reflux disease.  They report a history of esophageal dilation.  Exhaled NO: 06/19/2017 < 5 ppm  Spirometry:  January 2008 ratio 37%, FEV1 1.24 L 29% predicted  Chest imaging: May 16, 2017 CT scan of the shoulder: Small biapical pulmonary nodules, 7 mm right apical nodule, less than 0.3 cm right apical nodule     Assessment & Plan:   Asthma, unspecified asthma severity, unspecified whether complicated, unspecified whether persistent - Plan: Spirometry with Graph, Nitric oxide  Abnormal findings on diagnostic imaging of lung - Plan: CT Chest Wo Contrast, Alpha-1 antitrypsin phenotype  Discussion: Cole Chen comes back to our clinic to establish care for the first time in about 5 or 6 years.  He has profound airflow obstruction which presumably is related to an environmental exposure he had while working in the year 2000.  I do have a question about the possibility of trypsin deficiency considering the severity of his airflow obstruction.  Interestingly he seems to be very well controlled with Symbicort.  He has not had exacerbations in years.  I do wonder whether or not he has an non-eosinophilic or non-atopic phenotype of asthma.  He has been found recently to have 2 pulmonary nodules in the right upper lobe.  He is overall low risk considering the fact that he is a lifelong non-smoker but these need to be further evaluated.  He needs to have a full CT scan of his chest to evaluate for other nodules and to assess for whether or not he has bronchiectasis or emphysema in the setting of severe airflow obstruction.  Pulmonary nodule: We will arrange for a noncontrast CT scan of the chest to assess this further Assuming there is no growth in the pulmonary nodule we will plan on performing another one in 12  months Let us know if you have any weight loss, chest pain or cough up blood  Asthma: Continue taking Symbicort as you are doing Exhaled nitric oxide test today Spirometry test today   You can have a Prevnar vaccine then or sooner by her primary care doctor.  Follow up with our office in 3 weeks (after CT scan)   Current Outpatient Medications:  .  budesonide-formoterol (SYMBICORT) 160-4.5 MCG/ACT inhaler, Inhale 2 puffs into the lungs 2 (two) times daily., Disp: 3 Inhaler, Rfl: 3 .  omeprazole (PRILOSEC) 40 MG capsule, TAKE ONE CAPSULE BY MOUTH EVERY DAY, Disp: 30 capsule, Rfl: 0 .  omeprazole (PRILOSEC) 40 MG capsule, TAKE ONE CAPSULE BY MOUTH EVERY DAY, Disp: 30 capsule, Rfl: 0 .  calcium carbonate (TUMS - DOSED IN MG ELEMENTAL CALCIUM) 500 MG chewable tablet, Chew 1 tablet by mouth as needed for indigestion or heartburn., Disp: , Rfl:

## 2017-06-24 LAB — ALPHA-1 ANTITRYPSIN PHENOTYPE: A1 ANTITRYPSIN SER: 121 mg/dL (ref 83–199)

## 2017-06-27 ENCOUNTER — Other Ambulatory Visit: Payer: Self-pay

## 2017-06-27 ENCOUNTER — Ambulatory Visit
Admission: RE | Admit: 2017-06-27 | Discharge: 2017-06-27 | Disposition: A | Discharge status: Home / Self Care | Payer: Self-pay | Source: Ambulatory Visit | Attending: Pulmonary Disease | Admitting: Pulmonary Disease

## 2017-06-27 DIAGNOSIS — R918 Other nonspecific abnormal finding of lung field: Secondary | ICD-10-CM

## 2017-07-02 ENCOUNTER — Ambulatory Visit (INDEPENDENT_AMBULATORY_CARE_PROVIDER_SITE_OTHER)
Admission: RE | Admit: 2017-07-02 | Discharge: 2017-07-02 | Disposition: A | Discharge status: Home / Self Care | Payer: Federal, State, Local not specified - PPO | Source: Ambulatory Visit | Attending: Pulmonary Disease | Admitting: Pulmonary Disease

## 2017-07-02 DIAGNOSIS — R918 Other nonspecific abnormal finding of lung field: Secondary | ICD-10-CM | POA: Diagnosis not present

## 2017-07-02 DIAGNOSIS — R911 Solitary pulmonary nodule: Secondary | ICD-10-CM | POA: Diagnosis not present

## 2017-07-08 ENCOUNTER — Encounter: Payer: Federal, State, Local not specified - PPO | Admitting: Family Medicine

## 2017-07-10 ENCOUNTER — Telehealth: Payer: Self-pay | Admitting: Pulmonary Disease

## 2017-07-10 NOTE — Telephone Encounter (Signed)
Notes recorded by Lupita LeashMcQuaid, Douglas B, MD on 07/05/2017 at 4:09 PM EST A, Please let the patient know this was OK, no nodules. Some changes of asthma. Thanks, B  Advised pt of results. Pt understood and nothing further is needed.

## 2017-07-19 ENCOUNTER — Encounter: Payer: Self-pay | Admitting: Pulmonary Disease

## 2017-07-19 ENCOUNTER — Ambulatory Visit: Payer: Federal, State, Local not specified - PPO | Admitting: Pulmonary Disease

## 2017-07-19 VITALS — BP 122/80 | HR 97 | Ht 71.0 in | Wt 205.0 lb

## 2017-07-19 DIAGNOSIS — R911 Solitary pulmonary nodule: Secondary | ICD-10-CM | POA: Diagnosis not present

## 2017-07-19 DIAGNOSIS — J45909 Unspecified asthma, uncomplicated: Secondary | ICD-10-CM | POA: Diagnosis not present

## 2017-07-19 NOTE — Patient Instructions (Signed)
Severe persistent asthma: Keep taking Symbicort as you are doing Stay active Come back and see me in 1 year  Pulmonary nodule: Fortunately, this has resolved, no further CTs are needed  We will see you back in 1 year

## 2017-07-19 NOTE — Progress Notes (Signed)
Subjective:    Patient ID: Cole Chen, male    DOB: 06-28-1976, 41 y.o.   MRN: [id number] Synopsis: Former patient of Dr. Delford FieldWright with severe asthma.  He is a lifetime non-smoker.  Developed asthma after working for a [employer] company in [place].  Has a history of working in the [employer] for [employer].  Also has a history of esophageal stricture and underwent an esophageal dilation in July 2017.  HPI Chief Complaint  Patient presents with  . Follow-up   Fine since the last visit.  No recent flareups, no colitis.  He continues to take Symbicort.  No recent episodes of shortness of breath.  In general he has been stable.  He is here to follow-up his CT scan.  Past Medical History:  Diagnosis Date  . Asthma    Dr Delford FieldWright  . GERD (gastroesophageal reflux disease)   . Sciatic nerve pain    involves right leg, sees Dr. Okey Duprerawford for chiropractic care   . Sleep apnea, obstructive    sees Dr. Delford FieldWright     Review of Systems  Constitutional: Negative for activity change, appetite change, chills and fever.  HENT: Negative for congestion, ear pain, hearing loss, postnasal drip, rhinorrhea, sinus pressure and sneezing.   Eyes: Negative for redness, itching and visual disturbance.  Respiratory: Negative for cough, chest tightness, shortness of breath and wheezing.   Cardiovascular: Negative for chest pain, palpitations and leg swelling.  Gastrointestinal: Negative for abdominal distention, abdominal pain, blood in stool, constipation, diarrhea, nausea and vomiting.  Musculoskeletal: Negative for arthralgias, gait problem, joint swelling, myalgias, neck pain and neck stiffness.  Skin: Negative for rash.  Neurological: Negative for dizziness, light-headedness, numbness and headaches.  Hematological: Does not bruise/bleed easily.  Psychiatric/Behavioral: Negative for confusion and dysphoric mood.       Objective:   Physical Exam Vitals:   07/19/17 1613  BP: 122/80  Pulse: 97    SpO2: 99%  Weight: 205 lb (93 kg)  Height: 5\' 11"  (1.803 m)   Gen: well appearing HENT: OP clear, TM's clear, neck supple PULM: CTA B, normal percussion CV: RRR, no mgr, trace edema GI: BS+, soft, nontender Derm: no cyanosis or rash Psyche: normal mood and affect    Records from in October 2017 visit with primary care reviewed, it was a well visit, no problems at that time.  He had gastroesophageal reflux disease.  They report a history of esophageal dilation.  Exhaled NO: 06/19/2017 < 5 ppm  Spirometry:  January 2008 ratio 37%, FEV1 1.24 L 29% predicted  Chest imaging: May 16, 2017 CT scan of the shoulder: Small biapical pulmonary nodules, 7 mm right apical nodule, less than 0.3 cm right apical nodule November 2018 CT chest, no distinct nodules noted, some airway thickening consistent with asthma noted6 images independently reviewed    Assessment & Plan:   Asthma, unspecified asthma severity, unspecified whether complicated, unspecified whether persistent  Pulmonary nodule  Discussion: Fortunately the pulmonary nodules have resolved.  This is been a stable interval for him with his asthma.  He will continue to take Symbicort regularly.  Severe persistent asthma: Keep taking Symbicort as you are doing Stay active Come back and see me in 1 year  Pulmonary nodule: Fortunately, this has resolved, no further CTs are needed  We will see you back in 1 year   Current Outpatient Medications:  .  budesonide-formoterol (SYMBICORT) 160-4.5 MCG/ACT inhaler, Inhale 2 puffs into the lungs 2 (two) times daily., Disp:  3 Inhaler, Rfl: 3 .  calcium carbonate (TUMS - DOSED IN MG ELEMENTAL CALCIUM) 500 MG chewable tablet, Chew 1 tablet by mouth as needed for indigestion or heartburn., Disp: , Rfl:  .  omeprazole (PRILOSEC) 40 MG capsule, TAKE ONE CAPSULE BY MOUTH EVERY DAY, Disp: 30 capsule, Rfl: 0 .  omeprazole (PRILOSEC) 40 MG capsule, TAKE ONE CAPSULE BY MOUTH EVERY DAY, Disp:  30 capsule, Rfl: 0

## 2017-08-21 ENCOUNTER — Ambulatory Visit (INDEPENDENT_AMBULATORY_CARE_PROVIDER_SITE_OTHER): Payer: Federal, State, Local not specified - PPO | Admitting: Family Medicine

## 2017-08-21 ENCOUNTER — Encounter: Payer: Self-pay | Admitting: Family Medicine

## 2017-08-21 VITALS — BP 122/80 | HR 89 | Temp 97.6°F | Ht 70.5 in | Wt 205.8 lb

## 2017-08-21 DIAGNOSIS — Z125 Encounter for screening for malignant neoplasm of prostate: Secondary | ICD-10-CM | POA: Diagnosis not present

## 2017-08-21 DIAGNOSIS — Z0000 Encounter for general adult medical examination without abnormal findings: Secondary | ICD-10-CM | POA: Diagnosis not present

## 2017-08-21 LAB — POC URINALSYSI DIPSTICK (AUTOMATED)
BILIRUBIN UA: NEGATIVE
Blood, UA: NEGATIVE
Glucose, UA: NEGATIVE
KETONES UA: NEGATIVE
LEUKOCYTES UA: NEGATIVE
NITRITE UA: NEGATIVE
Protein, UA: NEGATIVE
Spec Grav, UA: 1.02 (ref 1.010–1.025)
Urobilinogen, UA: 0.2 E.U./dL
pH, UA: 6.5 (ref 5.0–8.0)

## 2017-08-21 LAB — CBC WITH DIFFERENTIAL/PLATELET
BASOS ABS: 0 10*3/uL (ref 0.0–0.1)
Basophils Relative: 0.4 % (ref 0.0–3.0)
EOS PCT: 5.3 % — AB (ref 0.0–5.0)
Eosinophils Absolute: 0.3 10*3/uL (ref 0.0–0.7)
HEMATOCRIT: 45.5 % (ref 39.0–52.0)
Hemoglobin: 15 g/dL (ref 13.0–17.0)
LYMPHS ABS: 1.6 10*3/uL (ref 0.7–4.0)
LYMPHS PCT: 26.8 % (ref 12.0–46.0)
MCHC: 32.9 g/dL (ref 30.0–36.0)
MCV: 92.6 fl (ref 78.0–100.0)
MONOS PCT: 8.8 % (ref 3.0–12.0)
Monocytes Absolute: 0.5 10*3/uL (ref 0.1–1.0)
NEUTROS ABS: 3.5 10*3/uL (ref 1.4–7.7)
NEUTROS PCT: 58.7 % (ref 43.0–77.0)
PLATELETS: 298 10*3/uL (ref 150.0–400.0)
RBC: 4.91 Mil/uL (ref 4.22–5.81)
RDW: 13.7 % (ref 11.5–15.5)
WBC: 5.9 10*3/uL (ref 4.0–10.5)

## 2017-08-21 LAB — BASIC METABOLIC PANEL
BUN: 11 mg/dL (ref 6–23)
CHLORIDE: 101 meq/L (ref 96–112)
CO2: 32 mEq/L (ref 19–32)
CREATININE: 1.02 mg/dL (ref 0.40–1.50)
Calcium: 9.4 mg/dL (ref 8.4–10.5)
GFR: 102.33 mL/min (ref >60.00)
GLUCOSE: 91 mg/dL (ref 70–99)
POTASSIUM: 4.4 meq/L (ref 3.5–5.1)
Sodium: 139 mEq/L (ref 135–145)

## 2017-08-21 LAB — HEPATIC FUNCTION PANEL
ALK PHOS: 110 U/L (ref 39–117)
ALT: 16 U/L (ref 0–53)
AST: 16 U/L (ref 0–37)
Albumin: 4.4 g/dL (ref 3.5–5.2)
BILIRUBIN DIRECT: 0.1 mg/dL (ref 0.0–0.3)
BILIRUBIN TOTAL: 0.6 mg/dL (ref 0.2–1.2)
Total Protein: 7.5 g/dL (ref 6.0–8.3)

## 2017-08-21 LAB — LIPID PANEL
CHOL/HDL RATIO: 3
Cholesterol: 155 mg/dL (ref 0–200)
HDL: 45.8 mg/dL (ref >39.00)
LDL Cholesterol: 91 mg/dL (ref 0–99)
NonHDL: 108.77
TRIGLYCERIDES: 90 mg/dL (ref 0.0–149.0)
VLDL: 18 mg/dL (ref 0.0–40.0)

## 2017-08-21 LAB — TSH: TSH: 0.78 u[IU]/mL (ref 0.35–4.50)

## 2017-08-21 LAB — PSA: PSA: 0.97 ng/mL (ref 0.10–4.00)

## 2017-08-21 NOTE — Progress Notes (Signed)
   Subjective:    Patient ID: Cole Chen, male    DOB: 1976-05-20, 42 y.o.   MRN: [id number]  HPI Here for a well exam. He feels well except for some left shoulder pain. He takes Aleve most days for this and gets fairly good results. He is scheduled to [facility] in a few weeks. His asthma is stable. He recently saw Dr. Kendrick FriesMcQuaid for this and had a chest CT which was unremarkable. 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    Review of Systems  Constitutional: Negative.   HENT: Negative.   Eyes: Negative.   Respiratory: Negative.   Cardiovascular: Negative.   Gastrointestinal: Negative.   Genitourinary: Negative.   Musculoskeletal: Positive for arthralgias. Negative for back pain, gait problem, joint swelling, myalgias, neck pain and neck stiffness.  Skin: Negative.   Neurological: Negative.   Psychiatric/Behavioral: Negative.        Objective:   Physical Exam  Constitutional: He is oriented to person, place, and time. He appears well-developed and well-nourished. No distress.  HENT:  Head: Normocephalic and atraumatic.  Right Ear: External ear normal.  Left Ear: External ear normal.  Nose: Nose normal.  Mouth/Throat: Oropharynx is clear and moist. No oropharyngeal exudate.  Eyes: Conjunctivae and EOM are normal. Pupils are equal, round, and reactive to light. Right eye exhibits no discharge. Left eye exhibits no discharge. No scleral icterus.  Neck: Neck supple. No JVD present. No tracheal deviation present. No thyromegaly present.  Cardiovascular: Normal rate, regular rhythm, normal heart sounds and intact distal pulses. Exam reveals no gallop and no friction rub.  No murmur heard. Pulmonary/Chest: Effort normal and breath sounds normal. No respiratory distress. He has no wheezes. He has no rales. He exhibits no tenderness.  Abdominal: Soft. Bowel sounds are normal. He exhibits no distension  and no mass. There is no tenderness. There is no rebound and no guarding.  Genitourinary: Rectum normal, prostate normal and penis normal. Rectal exam shows guaiac negative stool. No penile tenderness.  Musculoskeletal: Normal range of motion. He exhibits no edema or tenderness.  Lymphadenopathy:    He has no cervical adenopathy.  Neurological: He is alert and oriented to person, place, and time. He has normal reflexes. No cranial nerve deficit. He exhibits normal muscle tone. Coordination normal.  Skin: Skin is warm and dry. No rash noted. He is not diaphoretic. No erythema. No pallor.  Psychiatric: He has a normal mood and affect. His behavior is normal. Judgment and thought content normal.          Assessment & Plan:  Well exam. We discussed diet and exercise. Get fasting labs.  Gershon CraneStephen Zarayah Lanting, MD

## 2017-09-18 DIAGNOSIS — K080 Exfoliation of teeth due to systemic causes: Secondary | ICD-10-CM | POA: Diagnosis not present

## 2017-10-08 DIAGNOSIS — M069 Rheumatoid arthritis, unspecified: Secondary | ICD-10-CM | POA: Diagnosis not present

## 2017-10-16 DIAGNOSIS — M0579 Rheumatoid arthritis with rheumatoid factor of multiple sites without organ or systems involvement: Secondary | ICD-10-CM | POA: Diagnosis not present

## 2017-10-16 DIAGNOSIS — M19012 Primary osteoarthritis, left shoulder: Secondary | ICD-10-CM | POA: Diagnosis not present

## 2017-10-16 DIAGNOSIS — R918 Other nonspecific abnormal finding of lung field: Secondary | ICD-10-CM | POA: Diagnosis not present

## 2018-05-05 DIAGNOSIS — L853 Xerosis cutis: Secondary | ICD-10-CM | POA: Diagnosis not present

## 2018-05-05 DIAGNOSIS — L821 Other seborrheic keratosis: Secondary | ICD-10-CM | POA: Diagnosis not present

## 2018-05-07 DIAGNOSIS — H527 Unspecified disorder of refraction: Secondary | ICD-10-CM | POA: Diagnosis not present

## 2018-05-07 DIAGNOSIS — M069 Rheumatoid arthritis, unspecified: Secondary | ICD-10-CM | POA: Diagnosis not present

## 2018-05-07 DIAGNOSIS — Z79899 Other long term (current) drug therapy: Secondary | ICD-10-CM | POA: Diagnosis not present

## 2018-05-07 DIAGNOSIS — H04123 Dry eye syndrome of bilateral lacrimal glands: Secondary | ICD-10-CM | POA: Diagnosis not present

## 2018-05-12 DIAGNOSIS — Z23 Encounter for immunization: Secondary | ICD-10-CM | POA: Diagnosis not present

## 2018-06-17 IMAGING — CT CT CHEST W/O CM
2 of 3 series · 15 of 36 positions shown, 18 images · non-contrast
Comparison: CT of the shoulder dated 05/16/2017.

CLINICAL DATA: Lung nodule identified none outside shoulder CT.
History of asthma. Gastroesophageal reflux disease. Sleep apnea.
Prior esophageal dilatation. Never smoker.

EXAM:
CT CHEST WITHOUT CONTRAST
TECHNIQUE: Multidetector CT imaging of the chest was performed following the
standard protocol without IV contrast.

[Series 2: thorax · axial · 0.73mm/px · z∈[-322,-46]mm · 12 of 162 slices shown, 15 images]
[im 12/162  mediastinal]
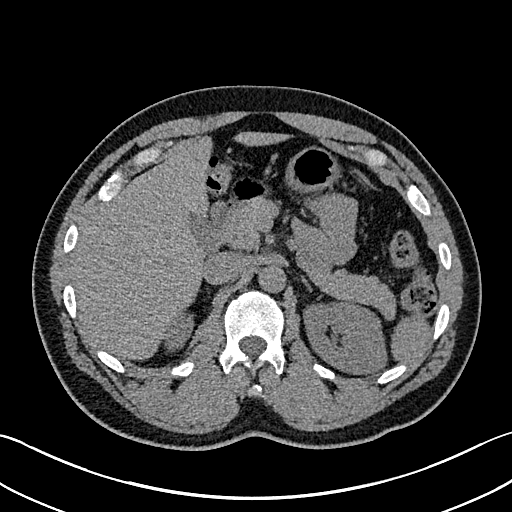
[im 12/162  lung]
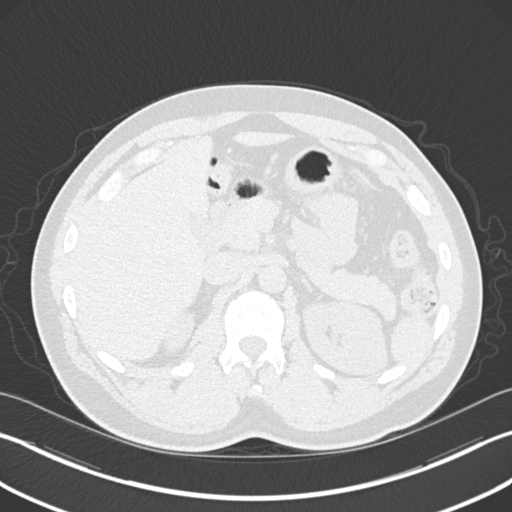
[im 24/162  lung]
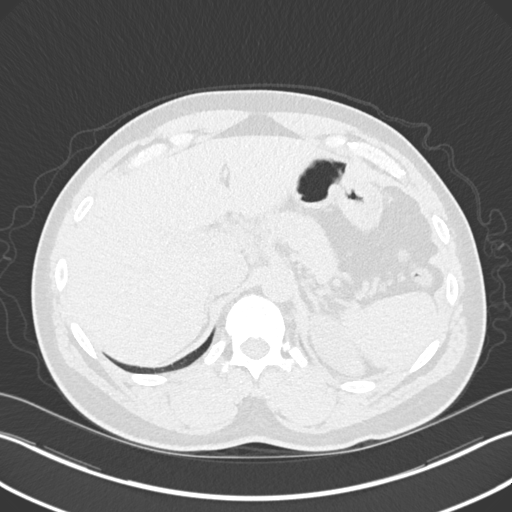
[im 36/162  lung]
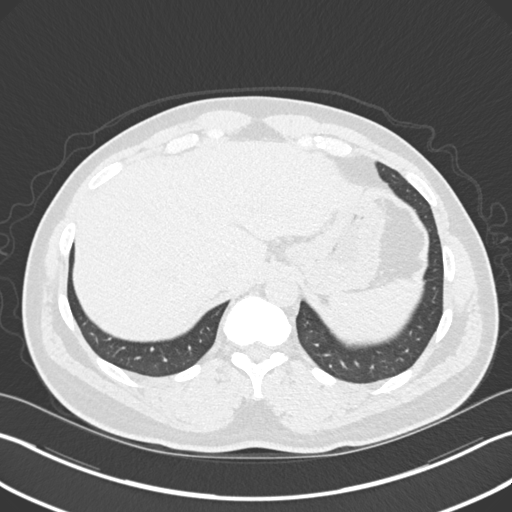
[im 48/162  lung]
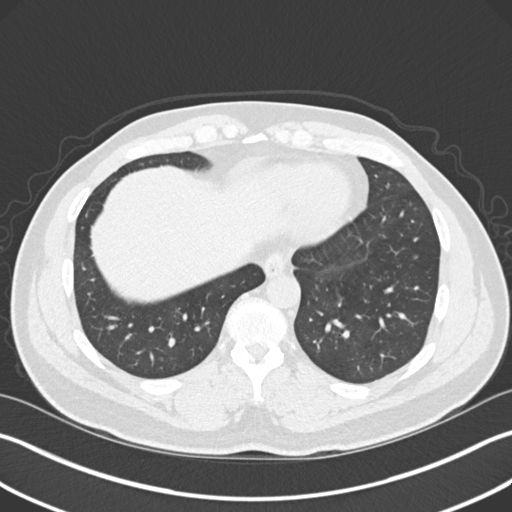
[im 60/162  mediastinal]
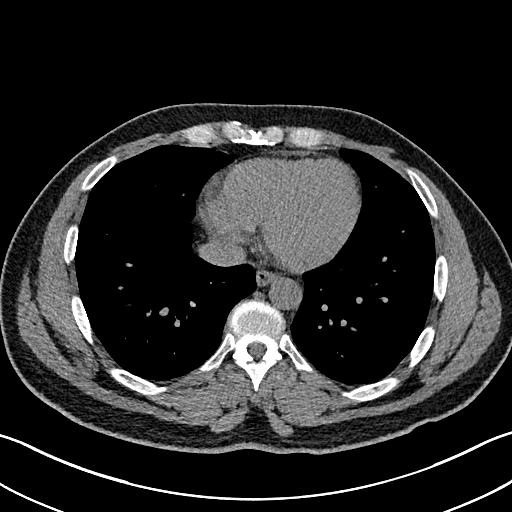
[im 60/162  lung]
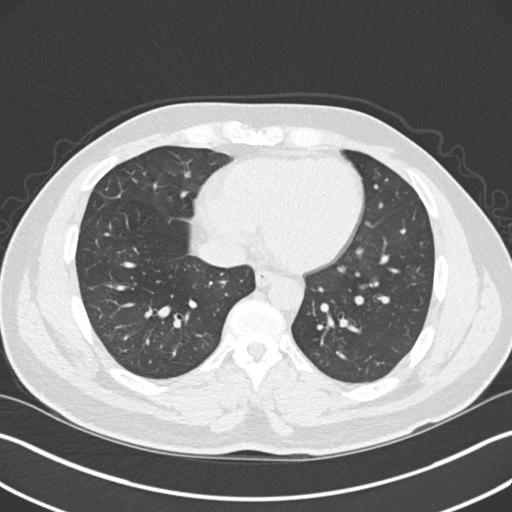
[im 72/162  lung]
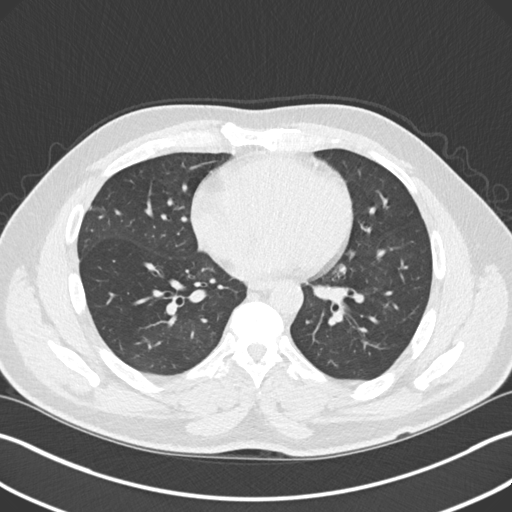
[im 90/162  lung]
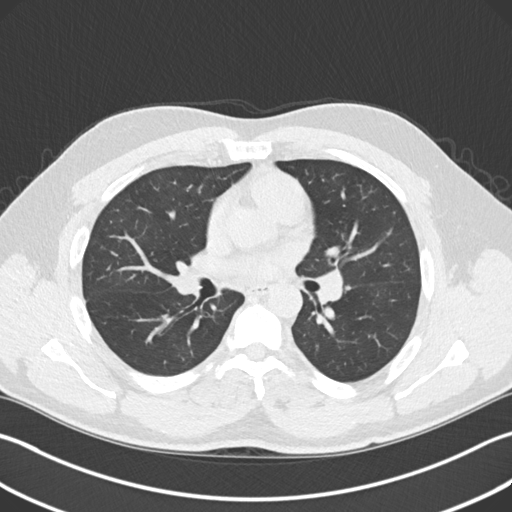
[im 102/162  lung]
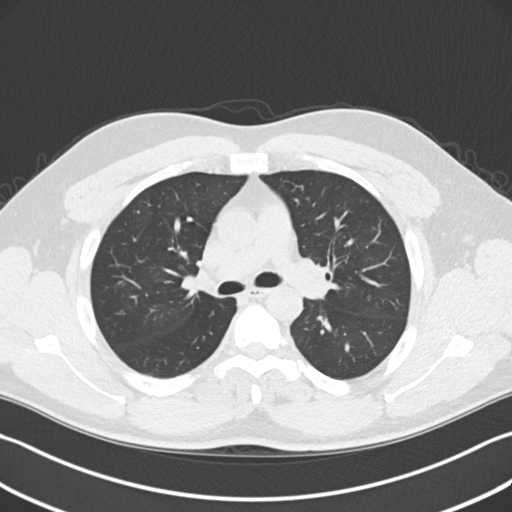
[im 114/162  mediastinal]
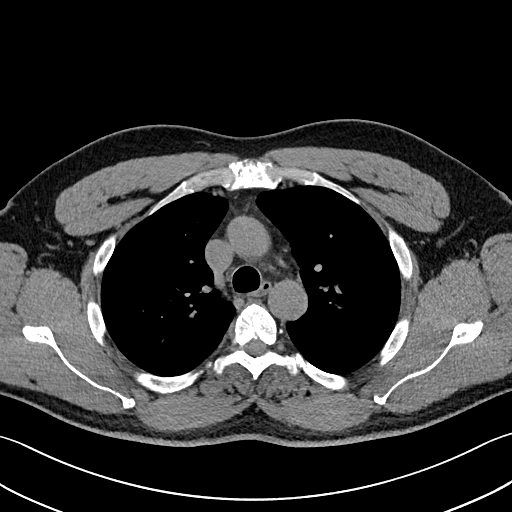
[im 114/162  lung]
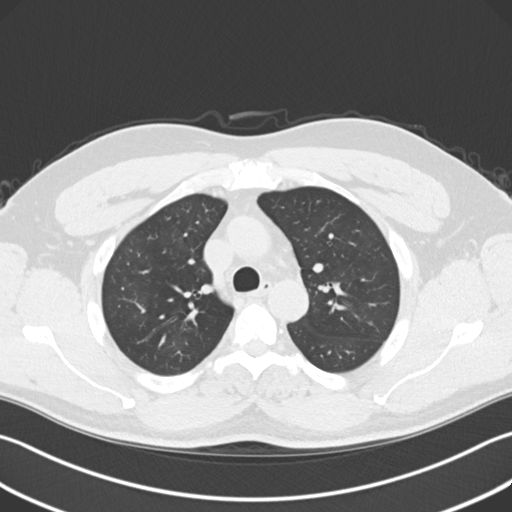
[im 126/162  lung]
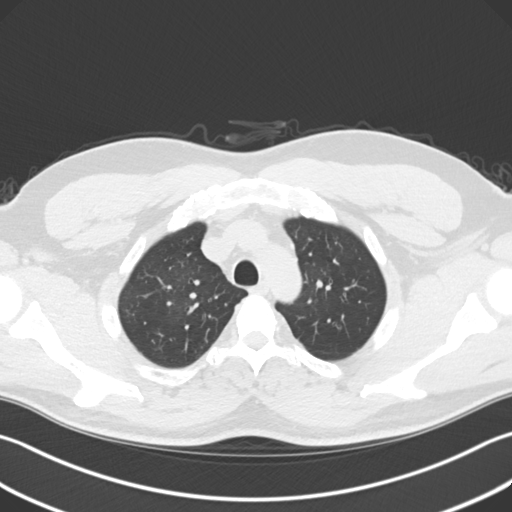
[im 138/162  lung]
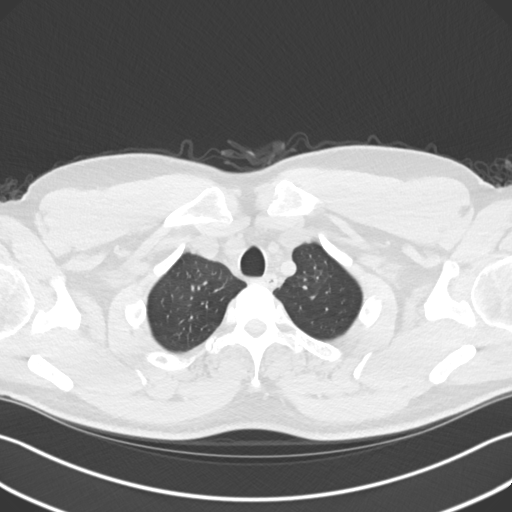
[im 150/162  lung]
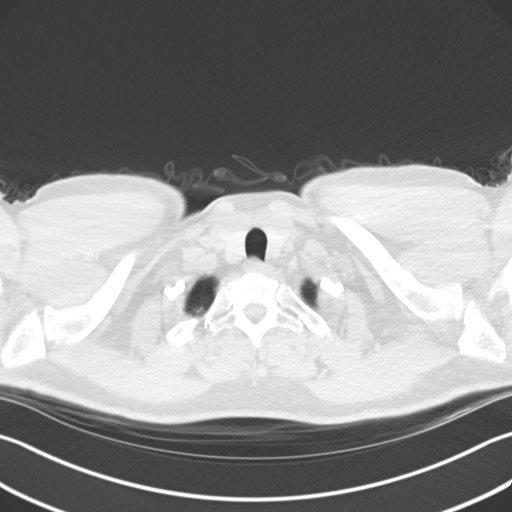

[Series 5: coronal · coronal · 0.63mm/px · 3 of 106 slices shown]
[im 22/106  lung]
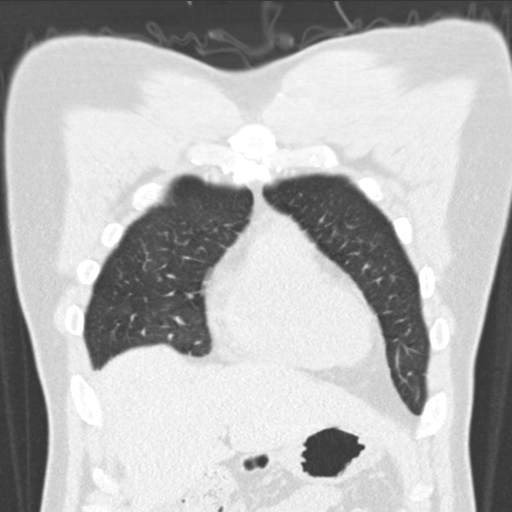
[im 43/106  lung]
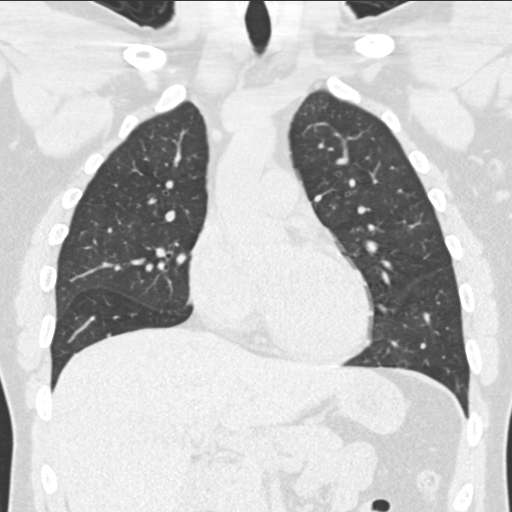
[im 64/106  lung]
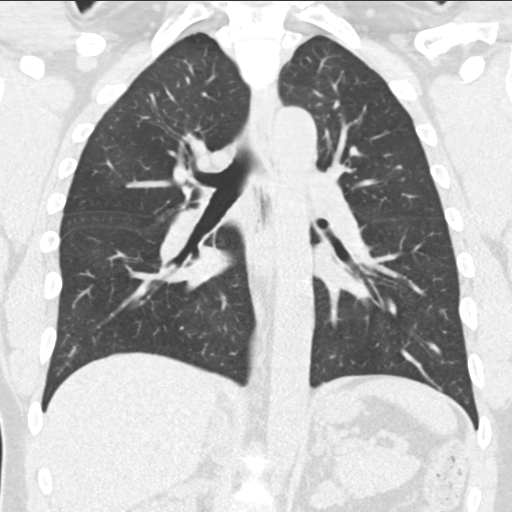

[15 of 36 positions shown; findings below may reference images not displayed]

[HOSPITAL]
CT of the shoulder dated 06/25/2014. Report of a chest CT dated
01/20/1999.
FINDINGS: Cardiovascular: Normal heart size, without pericardial effusion.

Mediastinum/Nodes: No mediastinal or definite hilar adenopathy,
given limitations of unenhanced CT.

Lungs/Pleura: No pleural fluid. Minimal biapical pleural-parenchymal
nodularity is felt to be due to scarring. Example on image 21/series
3. There is nonspecific peribronchovascular interstitial thickening
which is mild and could relate to the clinical history of asthma. No
well-defined dominant pulmonary nodule or mass. Minimal subpleural
nodularity in the left lower lobe at 4 mm on image 79/ series 3,
likely a subpleural lymph node.

Upper Abdomen: Normal imaged portions of the liver, spleen, stomach,
pancreas, gallbladder, biliary tract, adrenal glands, kidneys.

Musculoskeletal: No acute osseous abnormality.
IMPRESSION: 1. No acute process in the chest. No dominant or suspicious
pulmonary nodule identified. Minimal biapical pleural-parenchymal
scarring could have accounted for the abnormality on outside CT. No
outside report submitted.
2. Mild nonspecific peribronchovascular interstitial thickening is
upper lobe predominant and could relate to asthma.

## 2018-06-18 DIAGNOSIS — H527 Unspecified disorder of refraction: Secondary | ICD-10-CM | POA: Diagnosis not present

## 2018-08-20 ENCOUNTER — Encounter: Payer: Self-pay | Admitting: Pulmonary Disease

## 2018-08-20 ENCOUNTER — Ambulatory Visit: Payer: Federal, State, Local not specified - PPO | Admitting: Pulmonary Disease

## 2018-08-20 VITALS — BP 134/82 | HR 103 | Ht 70.5 in | Wt 207.0 lb

## 2018-08-20 DIAGNOSIS — J45909 Unspecified asthma, uncomplicated: Secondary | ICD-10-CM

## 2018-08-20 MED ORDER — AEROCHAMBER MV MISC: 0 refills | Status: AC

## 2018-08-20 NOTE — Addendum Note (Signed)
Addended by: Velvet Bathe on: 08/20/2018 11:15 AM   Modules accepted: Orders

## 2018-08-20 NOTE — Patient Instructions (Signed)
Severe persistent asthma: Continue Symbicort 2 puffs twice a day We will give you a spacer to use the Symbicort with If he still notes some breathlessness after using the spacer for a week please call us and we can help you navigate your medication formulary to see if generic Advair is a good option for you Keep using albuterol as needed for chest tightness wheezing or shortness of breath I am glad your flu shot is up-to-date Remain active  We will see you back in 1 year or sooner if needed

## 2018-08-20 NOTE — Progress Notes (Signed)
Subjective:    Patient ID: Cole Chen, male    DOB: [birth year], 43 y.o.   MRN: [id number] Synopsis: Former patient of Dr. Delford Chen with severe asthma.  He is a lifetime non-smoker.  Developed asthma after working for a [employer] company in [place].  Has a history of working in the [employer] for [employer].  Also has a history of esophageal stricture and underwent an esophageal dilation in July 2017.  HPI Chief Complaint  Patient presents with  . Follow-up    pt states he is doing well, does note occasional sob with exertion.     Cole Chen says that his year has been "pretty good".  He switched from Advair to Symbicort.  He doesn't feel like it works as well as the Advair.  He has noticed that he is winded when he climbs stairs.  No bronchitis or pneumonia, no prednisone use in the last year.  He says it was a normal year.  He uses albuterol about 1 time per week now.  He continues to work for the [employer] [place].    Past Medical History:  Diagnosis Date  . Asthma    sees Dr. Kendrick Chen   . GERD (gastroesophageal reflux disease)   . Sciatic nerve pain    involves right leg, sees Dr. Okey Chen for chiropractic care   . Sleep apnea, obstructive    sees Dr. Delford Chen     Review of Systems  Constitutional: Negative for activity change, appetite change, chills and fever.  HENT: Negative for congestion, ear pain, hearing loss, postnasal drip, rhinorrhea, sinus pressure and sneezing.   Eyes: Negative for redness, itching and visual disturbance.  Respiratory: Negative for cough, chest tightness, shortness of breath and wheezing.   Cardiovascular: Negative for chest pain, palpitations and leg swelling.  Gastrointestinal: Negative for abdominal distention, abdominal pain, blood in stool, constipation, diarrhea, nausea and vomiting.  Musculoskeletal: Negative for arthralgias, gait problem, joint swelling, myalgias, neck pain and neck stiffness.  Skin: Negative for rash.  Neurological: Negative  for dizziness, light-headedness, numbness and headaches.  Hematological: Does not bruise/bleed easily.  Psychiatric/Behavioral: Negative for confusion and dysphoric mood.       Objective:   Physical Exam Vitals:   08/20/18 0915  BP: 134/82  Pulse: (!) 103  SpO2: 98%  Weight: 207 lb (93.9 kg)  Height: 5' 10.5" (1.791 m)     Gen: well appearing HENT: OP clear, TM's clear, neck supple PULM: CTA B, normal percussion CV: RRR, no mgr, trace edema GI: BS+, soft, nontender Derm: no cyanosis or rash Psyche: normal mood and affect    Records from in October 2017 visit with primary care reviewed, it was a well visit, no problems at that time.  He had gastroesophageal reflux disease.  They report a history of esophageal dilation.  Exhaled NO: 06/19/2017 < 5 ppm  Spirometry:  January 2018 ratio 37%, FEV1 1.24 L 29% predicted  Chest imaging: May 16, 2017 CT scan of the shoulder: Small biapical pulmonary nodules, 7 mm right apical nodule, less than 0.3 cm right apical nodule November 2018 CT chest, no distinct nodules noted, some airway thickening consistent with asthma noted6 images independently reviewed    Assessment & Plan:   Asthma, unspecified asthma severity, unspecified whether complicated, unspecified whether persistent  Discussion: Though he has very severe airflow obstruction this has been a stable interval for Cole Chen.  He has not had an exacerbation since the last visit and his symptoms are fairly well-controlled.  Because he has such severe airflow obstruction I do not think that he should consider using as needed Symbicort.  He did note an uptake and some mild breathlessness after changing from Advair to Symbicort.  He may benefit from generic Advair if using Symbicort with a spacer doesn't help.  Plan: Severe persistent asthma: Continue Symbicort 2 puffs twice a day We will give you a spacer to use the Symbicort with If he still notes some breathlessness  after using the spacer for a week please call us and we can help you navigate your medication formulary to see if generic Advair is a good option for you Keep using albuterol as needed for chest tightness wheezing or shortness of breath I am glad your flu shot is up-to-date Remain active  We will see you back in 1 year or sooner if needed   Current Outpatient Medications:  .  albuterol (PROVENTIL HFA;VENTOLIN HFA) 108 (90 Base) MCG/ACT inhaler, Inhale 1-2 puffs into the lungs every 6 (six) hours as needed for wheezing or shortness of breath., Disp: , Rfl:  .  budesonide-formoterol (SYMBICORT) 160-4.5 MCG/ACT inhaler, Inhale 2 puffs into the lungs 2 (two) times daily., Disp: 3 Inhaler, Rfl: 3 .  calcium carbonate (TUMS - DOSED IN MG ELEMENTAL CALCIUM) 500 MG chewable tablet, Chew 1 tablet by mouth as needed for indigestion or heartburn., Disp: , Rfl:  .  omeprazole (PRILOSEC) 40 MG capsule, TAKE ONE CAPSULE BY MOUTH EVERY DAY, Disp: 30 capsule, Rfl: 0

## 2018-09-08 DIAGNOSIS — G4733 Obstructive sleep apnea (adult) (pediatric): Secondary | ICD-10-CM | POA: Diagnosis not present

## 2018-09-09 DIAGNOSIS — G4733 Obstructive sleep apnea (adult) (pediatric): Secondary | ICD-10-CM | POA: Diagnosis not present

## 2018-09-16 DIAGNOSIS — G4733 Obstructive sleep apnea (adult) (pediatric): Secondary | ICD-10-CM | POA: Diagnosis not present

## 2018-09-22 DIAGNOSIS — M059 Rheumatoid arthritis with rheumatoid factor, unspecified: Secondary | ICD-10-CM | POA: Diagnosis not present

## 2018-09-22 DIAGNOSIS — Z79899 Other long term (current) drug therapy: Secondary | ICD-10-CM | POA: Diagnosis not present

## 2018-09-22 DIAGNOSIS — R918 Other nonspecific abnormal finding of lung field: Secondary | ICD-10-CM | POA: Diagnosis not present

## 2018-09-22 DIAGNOSIS — M25512 Pain in left shoulder: Secondary | ICD-10-CM | POA: Diagnosis not present

## 2018-10-08 ENCOUNTER — Ambulatory Visit (INDEPENDENT_AMBULATORY_CARE_PROVIDER_SITE_OTHER): Payer: Federal, State, Local not specified - PPO | Admitting: Family Medicine

## 2018-10-08 ENCOUNTER — Encounter: Payer: Self-pay | Admitting: Family Medicine

## 2018-10-08 VITALS — BP 138/88 | HR 94 | Temp 98.4°F | Ht 71.0 in | Wt 209.5 lb

## 2018-10-08 DIAGNOSIS — K222 Esophageal obstruction: Secondary | ICD-10-CM

## 2018-10-08 DIAGNOSIS — Z0000 Encounter for general adult medical examination without abnormal findings: Secondary | ICD-10-CM | POA: Diagnosis not present

## 2018-10-08 DIAGNOSIS — R1310 Dysphagia, unspecified: Secondary | ICD-10-CM | POA: Diagnosis not present

## 2018-10-08 DIAGNOSIS — K219 Gastro-esophageal reflux disease without esophagitis: Secondary | ICD-10-CM | POA: Diagnosis not present

## 2018-10-08 DIAGNOSIS — Z125 Encounter for screening for malignant neoplasm of prostate: Secondary | ICD-10-CM | POA: Diagnosis not present

## 2018-10-08 LAB — HEPATIC FUNCTION PANEL
ALBUMIN: 4.2 g/dL (ref 3.5–5.2)
ALK PHOS: 133 U/L — AB (ref 39–117)
ALT: 36 U/L (ref 0–53)
AST: 26 U/L (ref 0–37)
Bilirubin, Direct: 0.1 mg/dL (ref 0.0–0.3)
TOTAL PROTEIN: 7.7 g/dL (ref 6.0–8.3)
Total Bilirubin: 0.5 mg/dL (ref 0.2–1.2)

## 2018-10-08 LAB — BASIC METABOLIC PANEL
BUN: 10 mg/dL (ref 6–23)
CHLORIDE: 102 meq/L (ref 96–112)
CO2: 30 mEq/L (ref 19–32)
CREATININE: 1.13 mg/dL (ref 0.40–1.50)
Calcium: 9.7 mg/dL (ref 8.4–10.5)
GFR: 85.1 mL/min (ref >60.00)
Glucose, Bld: 87 mg/dL (ref 70–99)
Potassium: 4.4 mEq/L (ref 3.5–5.1)
Sodium: 139 mEq/L (ref 135–145)

## 2018-10-08 LAB — CBC WITH DIFFERENTIAL/PLATELET
Basophils Absolute: 0 10*3/uL (ref 0.0–0.1)
Basophils Relative: 0.2 % (ref 0.0–3.0)
EOS ABS: 0.3 10*3/uL (ref 0.0–0.7)
Eosinophils Relative: 5.6 % — ABNORMAL HIGH (ref 0.0–5.0)
HEMATOCRIT: 44.6 % (ref 39.0–52.0)
Hemoglobin: 15.3 g/dL (ref 13.0–17.0)
Lymphocytes Relative: 30.5 % (ref 12.0–46.0)
Lymphs Abs: 1.9 10*3/uL (ref 0.7–4.0)
MCHC: 34.2 g/dL (ref 30.0–36.0)
MCV: 88.7 fl (ref 78.0–100.0)
Monocytes Absolute: 0.6 10*3/uL (ref 0.1–1.0)
Monocytes Relative: 9.4 % (ref 3.0–12.0)
Neutro Abs: 3.4 10*3/uL (ref 1.4–7.7)
Neutrophils Relative %: 54.3 % (ref 43.0–77.0)
Platelets: 306 10*3/uL (ref 150.0–400.0)
RBC: 5.03 Mil/uL (ref 4.22–5.81)
RDW: 13.6 % (ref 11.5–15.5)
WBC: 6.2 10*3/uL (ref 4.0–10.5)

## 2018-10-08 LAB — POC URINALSYSI DIPSTICK (AUTOMATED)
Bilirubin, UA: NEGATIVE
Glucose, UA: NEGATIVE
Ketones, UA: NEGATIVE
Leukocytes, UA: NEGATIVE
NITRITE UA: NEGATIVE
Protein, UA: POSITIVE — AB
Spec Grav, UA: 1.02 (ref 1.010–1.025)
Urobilinogen, UA: 0.2 E.U./dL
pH, UA: 6 (ref 5.0–8.0)

## 2018-10-08 LAB — LIPID PANEL
CHOL/HDL RATIO: 4
CHOLESTEROL: 168 mg/dL (ref 0–200)
HDL: 47.4 mg/dL (ref >39.00)
LDL Cholesterol: 94 mg/dL (ref 0–99)
NonHDL: 120.55
TRIGLYCERIDES: 135 mg/dL (ref 0.0–149.0)
VLDL: 27 mg/dL (ref 0.0–40.0)

## 2018-10-08 MED ORDER — OMEPRAZOLE 40 MG PO CPDR: 40.0000 mg | DELAYED_RELEASE_CAPSULE | Freq: Every day | ORAL | 11 refills | Status: DC

## 2018-10-08 NOTE — Progress Notes (Signed)
   Subjective:    Patient ID: Cole Chen, male    DOB: March 25, 1976, 43 y.o.   MRN: [id number]  HPI Here for a well exam. He feels fine. He has gained about 5 lbs in the past year.    Review of Systems  Constitutional: Negative.   HENT: Negative.   Eyes: Negative.   Respiratory: Negative.   Cardiovascular: Negative.   Gastrointestinal: Negative.   Genitourinary: Negative.   Musculoskeletal: Negative.   Skin: Negative.   Neurological: Negative.   Psychiatric/Behavioral: Negative.        Objective:   Physical Exam Constitutional:      General: He is not in acute distress.    Appearance: He is well-developed. He is not diaphoretic.  HENT:     Head: Normocephalic and atraumatic.     Right Ear: External ear normal.     Left Ear: External ear normal.     Nose: Nose normal.     Mouth/Throat:     Pharynx: No oropharyngeal exudate.  Eyes:     General: No scleral icterus.       Right eye: No discharge.        Left eye: No discharge.     Conjunctiva/sclera: Conjunctivae normal.     Pupils: Pupils are equal, round, and reactive to light.  Neck:     Musculoskeletal: Neck supple.     Thyroid: No thyromegaly.     Vascular: No JVD.     Trachea: No tracheal deviation.  Cardiovascular:     Rate and Rhythm: Normal rate and regular rhythm.     Heart sounds: Normal heart sounds. No murmur. No friction rub. No gallop.   Pulmonary:     Effort: Pulmonary effort is normal. No respiratory distress.     Breath sounds: Normal breath sounds. No wheezing or rales.  Chest:     Chest wall: No tenderness.  Abdominal:     General: Bowel sounds are normal. There is no distension.     Palpations: Abdomen is soft. There is no mass.     Tenderness: There is no abdominal tenderness. There is no guarding or rebound.  Genitourinary:    Penis: Normal. No tenderness.      Prostate: Normal.     Rectum: Normal. Guaiac result negative.  Musculoskeletal: Normal range of motion.        General: No  tenderness.  Lymphadenopathy:     Cervical: No cervical adenopathy.  Skin:    General: Skin is warm and dry.     Coloration: Skin is not pale.     Findings: No erythema or rash.  Neurological:     Mental Status: He is alert and oriented to person, place, and time.     Cranial Nerves: No cranial nerve deficit.     Motor: No abnormal muscle tone.     Coordination: Coordination normal.     Deep Tendon Reflexes: Reflexes are normal and symmetric. Reflexes normal.  Psychiatric:        Behavior: Behavior normal.        Thought Content: Thought content normal.        Judgment: Judgment normal.           Assessment & Plan:  Well exam. We discussed diet and exercise. Get fasting labs.  Gershon Crane, MD

## 2018-10-09 LAB — TSH: TSH: 0.93 u[IU]/mL (ref 0.35–4.50)

## 2018-10-09 LAB — PSA: PSA: 1.15 ng/mL (ref 0.10–4.00)

## 2018-11-06 DIAGNOSIS — Z79899 Other long term (current) drug therapy: Secondary | ICD-10-CM | POA: Diagnosis not present

## 2018-11-06 DIAGNOSIS — M059 Rheumatoid arthritis with rheumatoid factor, unspecified: Secondary | ICD-10-CM | POA: Diagnosis not present

## 2018-11-14 DIAGNOSIS — Z79899 Other long term (current) drug therapy: Secondary | ICD-10-CM | POA: Diagnosis not present

## 2018-11-14 DIAGNOSIS — M059 Rheumatoid arthritis with rheumatoid factor, unspecified: Secondary | ICD-10-CM | POA: Diagnosis not present

## 2018-12-12 ENCOUNTER — Encounter (HOSPITAL_COMMUNITY): Payer: Self-pay | Admitting: Emergency Medicine

## 2018-12-12 ENCOUNTER — Other Ambulatory Visit: Payer: Self-pay

## 2018-12-12 ENCOUNTER — Emergency Department (HOSPITAL_COMMUNITY)
Admission: EM | Admit: 2018-12-12 | Discharge: 2018-12-13 | Disposition: A | Discharge status: Home / Self Care | Payer: Federal, State, Local not specified - PPO | Attending: Emergency Medicine | Admitting: Emergency Medicine

## 2018-12-12 DIAGNOSIS — I10 Essential (primary) hypertension: Secondary | ICD-10-CM | POA: Diagnosis not present

## 2018-12-12 DIAGNOSIS — J45909 Unspecified asthma, uncomplicated: Secondary | ICD-10-CM | POA: Insufficient documentation

## 2018-12-12 DIAGNOSIS — Z79899 Other long term (current) drug therapy: Secondary | ICD-10-CM | POA: Insufficient documentation

## 2018-12-12 DIAGNOSIS — R42 Dizziness and giddiness: Secondary | ICD-10-CM | POA: Diagnosis not present

## 2018-12-12 NOTE — ED Triage Notes (Signed)
Pt is from home c/o vertigo x2 days worse today BP checked at home BP 198/110 when checked by Fire dept call to PMD and was told to be eval at ED.

## 2018-12-12 NOTE — ED Notes (Signed)
Pt denies any visual changes, HA and any hx of HTN.

## 2018-12-12 NOTE — ED Notes (Signed)
Bed: WA06 Expected date:  Expected time:  Means of arrival:  Comments: 

## 2018-12-12 NOTE — ED Provider Notes (Signed)
[facility]-EMERGENCY DEPT Provider Note   CSN: [id number] Arrival date & time: 12/12/18  2015    History   Chief Complaint Chief Complaint  Patient presents with  . Dizziness  . Hypertension    HPI Cole Chen is a 43 y.o. male.     HPI   He presents for evaluation of dizziness described as feeling off balance, present for 2 days.  No associated headache, blurred vision, double vision, fever, chills, nausea, vomiting, cough, chest pain, weakness or paresthesia.  No prior similar problems.  He was concerned that his blood pressure was elevated, so checked it at home and it was high.  And went to the fire department, they checked it and it was 190/110.  History of high blood pressure.  He works a job doing Diplomatic Services operational officer.  He denies significant stress or changes in life or lifestyle at this time.  He lives with his wife.  There are no other known modifying factors.  Past Medical History:  Diagnosis Date  . Asthma    sees Dr. Kendrick Fries   . GERD (gastroesophageal reflux disease)   . Sciatic nerve pain    involves right leg, sees Dr. Okey Dupre for chiropractic care   . Sleep apnea, obstructive    sees Dr. Delford Field     Patient Active Problem List   Diagnosis Date Noted  . Migraines 05/24/2015  . OBSTRUCTIVE SLEEP APNEA 09/14/2010  . GERD 05/26/2007  . ASTHMA 05/19/2007    Past Surgical History:  Procedure Laterality Date  . ESOPHAGEAL DILATION     Dr Russella Dar  08-22-06  . ESOPHAGOGASTRODUODENOSCOPY (EGD) WITH ESOPHAGEAL DILATION  03/07/2016   per Dr. Russella Dar, small hiatal hernia, duodenal erosions         Home Medications    Prior to Admission medications   Medication Sig Start Date End Date Taking? Authorizing Provider  albuterol (PROVENTIL HFA;VENTOLIN HFA) 108 (90 Base) MCG/ACT inhaler Inhale 1-2 puffs into the lungs every 6 (six) hours as needed for wheezing or shortness of breath.    [provider]  budesonide-formoterol (SYMBICORT)  160-4.5 MCG/ACT inhaler Inhale 2 puffs into the lungs 2 (two) times daily. 05/24/15   Nelwyn Salisbury, MD  calcium carbonate (TUMS - DOSED IN MG ELEMENTAL CALCIUM) 500 MG chewable tablet Chew 1 tablet by mouth as needed for indigestion or heartburn.    [provider]  omeprazole (PRILOSEC) 40 MG capsule Take 1 capsule (40 mg total) by mouth daily. 10/08/18   Nelwyn Salisbury, MD  Spacer/Aero-Holding Deretha Emory (AEROCHAMBER MV) inhaler Use as instructed 08/20/18   Lupita Leash, MD    Family History Family History  Problem Relation Age of Onset  . Cancer Mother        sarcoma  . Diabetes Paternal Grandfather   . Pancreatic cancer Paternal Grandfather   . Pancreatic cancer Father   . Colon cancer Neg Hx   . Esophageal cancer Neg Hx   . Stomach cancer Neg Hx   . Liver disease Neg Hx   . Kidney disease Neg Hx   . Heart disease Neg Hx     Social History Social History   Tobacco Use  . Smoking status: Never Smoker  . Smokeless tobacco: Never Used  Substance Use Topics  . Alcohol use: Yes    Alcohol/week: 0.0 standard drinks    Comment: occasional  . Drug use: No     Allergies   Bismuth subsalicylate   Review of Systems Review of  Systems  All other systems reviewed and are negative.    Physical Exam Updated Vital Signs BP (!) 159/103 (BP Location: Left Arm)   Pulse 98   Temp 98.3 F (36.8 C)   Resp 18   SpO2 98%   Physical Exam Vitals signs and nursing note reviewed.  Constitutional:      General: He is not in acute distress.    Appearance: Normal appearance. He is well-developed. He is not ill-appearing, toxic-appearing or diaphoretic.  HENT:     Head: Normocephalic and atraumatic.     Right Ear: External ear normal.     Left Ear: External ear normal.  Eyes:     Conjunctiva/sclera: Conjunctivae normal.     Pupils: Pupils are equal, round, and reactive to light.  Neck:     Musculoskeletal: Normal range of motion and neck supple.     Trachea:  Phonation normal.  Cardiovascular:     Rate and Rhythm: Normal rate and regular rhythm.     Heart sounds: Normal heart sounds.  Pulmonary:     Effort: Pulmonary effort is normal.     Breath sounds: Normal breath sounds.  Abdominal:     Palpations: Abdomen is soft.     Tenderness: There is no abdominal tenderness.  Musculoskeletal: Normal range of motion.  Skin:    General: Skin is warm and dry.  Neurological:     Mental Status: He is alert and oriented to person, place, and time.     Cranial Nerves: No cranial nerve deficit.     Sensory: No sensory deficit.     Motor: No abnormal muscle tone.     Coordination: Coordination normal.     Comments: No dysarthria or aphasia.  No pronator drift.  No ataxia.  Psychiatric:        Mood and Affect: Mood normal.        Behavior: Behavior normal.        Thought Content: Thought content normal.        Judgment: Judgment normal.      ED Treatments / Results  Labs (all labs ordered are listed, but only abnormal results are displayed) Labs Reviewed  I-STAT CREATININE, ED  CBG MONITORING, ED    EKG EKG Interpretation  Date/Time:  Friday Dec 12 2018 20:25:30 EDT Ventricular Rate:  96 PR Interval:    QRS Duration: 93 QT Interval:  355 QTC Calculation: 449 R Axis:   83 Text Interpretation:  Sinus rhythm Baseline wander in lead(s) V1 No old tracing to compare Confirmed by Mancel BaleWentz, Jabreel Chimento [phone]([id number]) on 12/12/2018 10:42:27 PM   Radiology No results found.  Procedures Procedures (including critical care time)  Medications Ordered in ED Medications - No data to display   Initial Impression / Assessment and Plan / ED Course  I have reviewed the triage vital signs and the nursing notes.  Pertinent labs & imaging results that were available during my care of the patient were reviewed by me and considered in my medical decision making (see chart for details).         Patient Vitals for the past 24 hrs:  BP Temp Pulse Resp SpO2   12/12/18 2024 (!) 159/103 98.3 F (36.8 C) 98 18 98 %    11:18 PM Reevaluation with update and discussion. After initial assessment and treatment, an updated evaluation reveals no change in clinical status, labs still pending. Mancel BaleElliott Idabell Picking   Medical Decision Making: Mild hypertension with dizziness.  Doubt intracranial lesion.  Evaluation  pending  CRITICAL CARE-no Performed by: Mancel Bale  Nursing Notes Reviewed/ Care Coordinated Applicable Imaging Reviewed Interpretation of Laboratory Data incorporated into ED treatment  Plan: Disposition after return of labs, by oncoming provider team   Final Clinical Impressions(s) / ED Diagnoses   Final diagnoses:  Hypertension, unspecified type    ED Discharge Orders    None       Mancel Bale, MD 12/12/18 2320

## 2018-12-13 LAB — POCT I-STAT EG7
Acid-Base Excess: 3 mmol/L — ABNORMAL HIGH (ref 0.0–2.0)
Bicarbonate: 29.7 mmol/L — ABNORMAL HIGH (ref 20.0–28.0)
Calcium, Ion: 1.2 mmol/L (ref 1.15–1.40)
HCT: 46 % (ref 39.0–52.0)
Hemoglobin: 15.6 g/dL (ref 13.0–17.0)
O2 Saturation: 41 %
Potassium: 3.8 mmol/L (ref 3.5–5.1)
Sodium: 138 mmol/L (ref 135–145)
TCO2: 31 mmol/L (ref 22–32)
pCO2, Ven: 50.3 mmHg (ref 44.0–60.0)
pH, Ven: 7.379 (ref 7.250–7.430)
pO2, Ven: 24 mmHg — CL (ref 32.0–45.0)

## 2018-12-13 LAB — I-STAT CREATININE, ED: Creatinine, Ser: 1.2 mg/dL (ref 0.61–1.24)

## 2018-12-13 NOTE — ED Provider Notes (Signed)
Care assumed from Dr. Effie Shy, patient presenting with elevated blood pressure.  Screening labs are unremarkable.  Blood pressure has come down, but not completely to normal.  Patient is advised to monitor his blood pressure at home, follow-up with PCP.  Advised to stay on a low-salt diet.  Results for orders placed or performed during the hospital encounter of 12/12/18  I-stat Creatinine, ED  Result Value Ref Range   Creatinine, Ser 1.20 0.61 - 1.24 mg/dL  POCT I-Stat EG7  Result Value Ref Range   pH, Ven 7.379 7.250 - 7.430   pCO2, Ven 50.3 44.0 - 60.0 mmHg   pO2, Ven 24.0 (LL) 32.0 - 45.0 mmHg   Bicarbonate 29.7 (H) 20.0 - 28.0 mmol/L   TCO2 31 22 - 32 mmol/L   O2 Saturation 41.0 %   Acid-Base Excess 3.0 (H) 0.0 - 2.0 mmol/L   Sodium 138 135 - 145 mmol/L   Potassium 3.8 3.5 - 5.1 mmol/L   Calcium, Ion 1.20 1.15 - 1.40 mmol/L   HCT 46.0 39.0 - 52.0 %   Hemoglobin 15.6 13.0 - 17.0 g/dL   Patient temperature HIDE    Sample type VENOUS    Comment NOTIFIED PHYSICIAN       Dione Booze, MD 12/13/18 0221

## 2018-12-13 NOTE — Discharge Instructions (Signed)
Monitor your blood pressure at home and keep a record of it. Take that record with you when you see your primary care provider. If your blood pressure stays high, you will need to start taking medication to control it.

## 2018-12-15 ENCOUNTER — Ambulatory Visit (INDEPENDENT_AMBULATORY_CARE_PROVIDER_SITE_OTHER): Payer: Federal, State, Local not specified - PPO | Admitting: Family Medicine

## 2018-12-15 ENCOUNTER — Other Ambulatory Visit: Payer: Self-pay

## 2018-12-15 ENCOUNTER — Encounter: Payer: Self-pay | Admitting: Family Medicine

## 2018-12-15 DIAGNOSIS — I10 Essential (primary) hypertension: Secondary | ICD-10-CM | POA: Diagnosis not present

## 2018-12-15 MED ORDER — LISINOPRIL 10 MG PO TABS: 10.0000 mg | ORAL_TABLET | Freq: Every day | ORAL | 3 refills | Status: DC

## 2018-12-15 NOTE — Progress Notes (Signed)
   Subjective:    Patient ID: Cole Chen, male    DOB: 1976/08/01, 43 y.o.   MRN: [id number]  HPI Virtual Visit via Telephone Note  I connected with the patient on 12/15/18 at  8:45 AM EDT by telephone and verified that I am speaking with the correct person using two identifiers. We attempted to connect via [email or web address] but we had technical difficulties with the audio portion.    I discussed the limitations, risks, security and privacy concerns of performing an evaluation and management service by telephone and the availability of in person appointments. I also discussed with the patient that there may be a patient responsible charge related to this service. The patient expressed understanding and agreed to proceed.  Location patient: home Location provider: work or home office Participants present for the call: patient, provider Patient did not have a visit in the prior 7 days to address this/these issue(s).   History of Present Illness: This is to follow up an ER visit on 12-12-18. For several days prior to that he had felt dizzy, sometimes he felt like his equilibrium was off and sometimes the room would seem to move around him. No headache or SOB or chest pain. He was checking his BP frequently at home for several days. And it was running as high as 190/110. At the ER it was 159/103 on arrival, and by the time he was sent home it had settled down to 138/97. His exam was normal. EKG was normal. Labs were normal including a creatinine of 1.20. His mother has dealt with high BP for years. Since the ER visit his BP has been stable in the 130s over 80s. Today he feels fine with no dizziness at all. He has never had vertigo before.    Observations/Objective: Patient sounds cheerful and well on the phone. I do not appreciate any SOB. Speech and thought processing are grossly intact. Patient reported vitals:  Assessment and Plan: New onset HTN. We agreed to start on treatment. He will take  Lisinopril 10 mg daily. We spoke about getting regular exercise and limiting the sodium on his diet. We will follow up in 3-4 weeks.  Gershon Crane, MD   Follow Up Instructions:     [phone] 11-20 [phone] 21-30 I did not refer this patient for an OV in the next 24 hours for this/these issue(s).  I discussed the assessment and treatment plan with the patient. The patient was provided an opportunity to ask questions and all were answered. The patient agreed with the plan and demonstrated an understanding of the instructions.   The patient was advised to call back or seek an in-person evaluation if the symptoms worsen or if the condition fails to improve as anticipated.  I provided 22 minutes of non-face-to-face time during this encounter.   Gershon Crane, MD    Review of Systems     Objective:   Physical Exam        Assessment & Plan:

## 2018-12-23 DIAGNOSIS — R030 Elevated blood-pressure reading, without diagnosis of hypertension: Secondary | ICD-10-CM | POA: Diagnosis not present

## 2019-03-23 DIAGNOSIS — M059 Rheumatoid arthritis with rheumatoid factor, unspecified: Secondary | ICD-10-CM | POA: Diagnosis not present

## 2019-03-23 DIAGNOSIS — M2550 Pain in unspecified joint: Secondary | ICD-10-CM | POA: Diagnosis not present

## 2019-04-19 ENCOUNTER — Other Ambulatory Visit: Payer: Self-pay | Admitting: Family Medicine

## 2019-05-07 DIAGNOSIS — M059 Rheumatoid arthritis with rheumatoid factor, unspecified: Secondary | ICD-10-CM | POA: Diagnosis not present

## 2019-07-12 DIAGNOSIS — Z20828 Contact with and (suspected) exposure to other viral communicable diseases: Secondary | ICD-10-CM | POA: Diagnosis not present

## 2019-07-27 ENCOUNTER — Telehealth: Payer: Self-pay | Admitting: *Deleted

## 2019-07-27 NOTE — Telephone Encounter (Signed)
Copied from [place] [id number]. Topic: Appointment Scheduling - Scheduling Inquiry for Clinic >> Jul 27, 2019 12:05 PM Reyne Dumas L wrote: Reason for CRM:  Pt wants to know if Dr. Sarajane Jews can remove moles and if so he would like to schedule an appointment to have that done.

## 2019-07-27 NOTE — Telephone Encounter (Signed)
Spoke with patient. Appointment has been scheduled

## 2019-07-27 NOTE — Telephone Encounter (Signed)
Please advise. Ok to schedule appointment?

## 2019-07-27 NOTE — Telephone Encounter (Signed)
He would have to have an in person OV first to take a look at the lesions

## 2019-07-31 ENCOUNTER — Ambulatory Visit: Payer: Federal, State, Local not specified - PPO | Admitting: Family Medicine

## 2019-11-19 ENCOUNTER — Ambulatory Visit: Payer: Federal, State, Local not specified - PPO | Admitting: Critical Care Medicine

## 2019-11-19 ENCOUNTER — Other Ambulatory Visit: Payer: Self-pay

## 2019-11-19 ENCOUNTER — Encounter: Payer: Self-pay | Admitting: Critical Care Medicine

## 2019-11-19 VITALS — BP 138/78 | HR 94 | Temp 97.3°F | Ht 71.0 in | Wt 199.2 lb

## 2019-11-19 DIAGNOSIS — J309 Allergic rhinitis, unspecified: Secondary | ICD-10-CM | POA: Diagnosis not present

## 2019-11-19 DIAGNOSIS — J4550 Severe persistent asthma, uncomplicated: Secondary | ICD-10-CM

## 2019-11-19 MED ORDER — ALBUTEROL SULFATE HFA 108 (90 BASE) MCG/ACT IN AERS: 1.0000 | INHALATION_SPRAY | Freq: Four times a day (QID) | RESPIRATORY_TRACT | 11 refills | Status: DC | PRN

## 2019-11-19 MED ORDER — BUDESONIDE-FORMOTEROL FUMARATE 160-4.5 MCG/ACT IN AERO: 2.0000 | INHALATION_SPRAY | Freq: Two times a day (BID) | RESPIRATORY_TRACT | 3 refills | Status: DC

## 2019-11-19 NOTE — Progress Notes (Signed)
Synopsis: Referred in 2018 for asthma by Cole Salisbury, MD.  Formerly a patient of Dr. Kendrick Chen.  Subjective:   PATIENT ID: Cole Chen GENDER: male DOB: 1975-09-24, MRN: [id number]  Chief Complaint  Patient presents with  . Consult    Cole Chen is a 44 year old gentleman with a history of severe persistent asthma who presents for follow-up.  He was diagnosed after working at [employer] in [place].  He required hospitalization in the ICU around the time of his original diagnosis.  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  He no longer works in [employer]; he currently works for the [employer] in [place].  He has had stable symptoms since his last follow-up year ago.  No antibiotics, prednisone, or asthma related urgent care/ED visits.  He has been using his albuterol when riding his bike for exercise recently for more shortness of breath, but using it prior to exercise has improved his symptoms.  He otherwise has not needed albuterol.  He remains on Symbicort twice daily.  He has chronic allergies which are well controlled with Zyrtec and Flonase.  He did not have childhood asthma, but he has a brother with asthma.  He never smoked.  No nocturnal symptoms, cough, sputum production, wheezing.  He has received both Covid vaccines.    Flowsheet data:  Lab Results  Component Value Date   NITRICOXIDE <5 06/19/2017    Past Medical History:  Diagnosis Date  . Asthma    sees Dr. Kendrick Chen   . GERD (gastroesophageal reflux disease)   . Sciatic nerve pain    involves right leg, sees Dr. Okey Chen for chiropractic care   . Sleep apnea, obstructive    sees Dr. Delford Chen      Family History  Problem Relation Age of Onset  . Cancer Mother        sarcoma  . Diabetes Paternal Grandfather   . Pancreatic cancer Paternal Grandfather   . Pancreatic cancer Father   . Colon cancer Neg Hx   . Esophageal cancer Neg Hx   . Stomach cancer Neg Hx   .  Liver disease Neg Hx   . Kidney disease Neg Hx   . Heart disease Neg Hx      Past Surgical History:  Procedure Laterality Date  . ESOPHAGEAL DILATION     Dr Cole Chen  08-22-06  . ESOPHAGOGASTRODUODENOSCOPY (EGD) WITH ESOPHAGEAL DILATION  03/07/2016   per Dr. Russella Chen, small hiatal hernia, duodenal erosions     Social History   Socioeconomic History  . Marital status: Married    Spouse name: Not on file  . Number of children: Not on file  . Years of education: Not on file  . Highest education level: Not on file  Occupational History  . Not on file  Tobacco Use  . Smoking status: Never Smoker  . Smokeless tobacco: Never Used  Substance and Sexual Activity  . Alcohol use: Yes    Alcohol/week: 0.0 standard drinks    Comment: occasional  . Drug use: No  . Sexual activity: Not on file  Other Topics Concern  . Not on file  Social History Narrative  . Not on file   Social Determinants of Health   Financial Resource Strain:   . Difficulty of Paying Living Expenses:   Food Insecurity:   . Worried About Programme researcher, broadcasting/film/video in the Last Year:   . [employer]  in the Last Year:   Transportation Needs:   . Film/video editor (Medical):   Marland Kitchen Lack of Transportation (Non-Medical):   Physical Activity:   . Days of Exercise per Week:   . Minutes of Exercise per Session:   Stress:   . Feeling of Stress :   Social Connections:   . Frequency of Communication with Friends and Family:   . Frequency of Social Gatherings with Friends and Family:   . Attends Religious Services:   . Active Member of Clubs or Organizations:   . Attends Archivist Meetings:   Marland Kitchen Marital Status:   Intimate Partner Violence:   . Fear of Current or Ex-Partner:   . Emotionally Abused:   Marland Kitchen Physically Abused:   . Sexually Abused:      Allergies  Allergen Reactions  . Bismuth Subsalicylate     Blisters in mouth  . Grass Pollen(K-O-R-T-Swt Vern) Itching    All grasses, causes itching to eye,  runny nose, itching in throat.     Immunization History  Administered Date(s) Administered  . Influenza Split 07/06/2011, 06/25/2012, 06/20/2018  . Influenza Whole 05/26/2007, 05/20/2009  . Influenza,inj,Quad PF,6+ Mos 05/12/2014, 05/24/2015, 05/31/2016  . Influenza,inj,Quad PF,6-35 Mos 05/14/2019  . Pneumococcal Polysaccharide-23 05/20/2009    Outpatient Medications Prior to Visit  Medication Sig Dispense Refill  . calcium carbonate (TUMS - DOSED IN MG ELEMENTAL CALCIUM) 500 MG chewable tablet Chew 1 tablet by mouth as needed for indigestion or heartburn.    Marland Kitchen omeprazole (PRILOSEC) 40 MG capsule Take 1 capsule (40 mg total) by mouth daily. 30 capsule 11  . Spacer/Aero-Holding Chambers (AEROCHAMBER MV) inhaler Use as instructed 1 each 0  . albuterol (PROVENTIL HFA;VENTOLIN HFA) 108 (90 Base) MCG/ACT inhaler Inhale 1-2 puffs into the lungs every 6 (six) hours as needed for wheezing or shortness of breath.    . budesonide-formoterol (SYMBICORT) 160-4.5 MCG/ACT inhaler Inhale 2 puffs into the lungs 2 (two) times daily. 3 Inhaler 3  . lisinopril (ZESTRIL) 10 MG tablet TAKE 1 TABLET BY MOUTH EVERY DAY (Patient not taking: Reported on 11/19/2019) 30 tablet 3   No facility-administered medications prior to visit.    Review of Systems  Constitutional: Negative.   Respiratory: Negative.   Cardiovascular: Negative.   Endo/Heme/Allergies: Positive for environmental allergies.     Objective:   Vitals:   11/19/19 1128  BP: 138/78  Pulse: 94  Temp: (!) 97.3 F (36.3 C)  TempSrc: Temporal  SpO2: 98%  Weight: 199 lb 3.2 oz (90.4 kg)  Height: 5\' 11"  (1.803 m)   98% on   RA BMI Readings from Last 3 Encounters:  11/19/19 27.78 kg/m  10/08/18 29.22 kg/m  08/20/18 29.28 kg/m   Wt Readings from Last 3 Encounters:  11/19/19 199 lb 3.2 oz (90.4 kg)  10/08/18 209 lb 8 oz (95 kg)  08/20/18 207 lb (93.9 kg)    Physical Exam Vitals reviewed.  Constitutional:      General: He is not  in acute distress.    Appearance: He is not ill-appearing.  HENT:     Head: Normocephalic and atraumatic.  Eyes:     General: No scleral icterus. Cardiovascular:     Rate and Rhythm: Normal rate and regular rhythm.     Heart sounds: No murmur.  Pulmonary:     Comments: Breathing comfortably on room air, no conversational dyspnea.  Clear to auscultation bilaterally. Abdominal:     General: There is no distension.  Musculoskeletal:  General: No swelling or deformity.     Cervical back: Neck supple.  Lymphadenopathy:     Cervical: No cervical adenopathy.  Skin:    General: Skin is warm and dry.     Findings: No rash.  Neurological:     General: No focal deficit present.     Mental Status: He is alert.     Coordination: Coordination normal.  Psychiatric:        Mood and Affect: Mood normal.        Behavior: Behavior normal.      CBC    Component Value Date/Time   WBC 6.2 10/08/2018 0834   RBC 5.03 10/08/2018 0834   HGB 15.6 12/13/2018 0137   HCT 46.0 12/13/2018 0137   PLT 306.0 10/08/2018 0834   MCV 88.7 10/08/2018 0834   MCHC 34.2 10/08/2018 0834   RDW 13.6 10/08/2018 0834   LYMPHSABS 1.9 10/08/2018 0834   MONOABS 0.6 10/08/2018 0834   EOSABS 0.3 10/08/2018 0834   BASOSABS 0.0 10/08/2018 0834   eos 670 on 10/4//2016, 560 on 10/08/2018 No IgE on file  CHEMISTRY No results for input(s): NA, K, CL, CO2, GLUCOSE, BUN, CREATININE, CALCIUM, MG, PHOS in the last 168 hours. CrCl cannot be calculated (Patient's most recent lab result is older than the maximum 21 days allowed.).   Chest Imaging- films reviewed: CT chest 07/02/2017-right upper lobe posterior subpleural nodules.  Airway thickening diffusely.  No mediastinal or hilar adenopathy.  Pulmonary Functions Testing Results: No flowsheet data found.  Spirometry 06/19/2017: FVC 2.8 (61%) FEV1 1.0 (27%) Ratio 35      Assessment & Plan:     ICD-10-CM   1. Severe persistent asthma, unspecified whether  complicated  J45.50   2. Allergic rhinitis, unspecified seasonality, unspecified trigger  J30.9     Severe persistent asthma.  Although his occupational asthma, he might have an allergic component as well.  Elevated eosinophils on previous CBCs as well as a history of allergic rhinosinusitis. -Continue Symbicort twice daily.  Instructed to rinse his mouth after use. -Continue albuterol as needed.  Agree with using it prior to exercise. -Glad that he is physically active.  Continue regular activity to maintain exercise tolerance -Up-to-date on Covid and flu vaccines -If asthma symptoms are ever uncontrolled on inhalers, he would likely be a candidate for Biologics with history of eosinophilia.  Allergic rhinosinusitis -Continue Zyrtec and Flonase as needed RTC in 1 year.   Current Outpatient Medications:  .  albuterol (VENTOLIN HFA) 108 (90 Base) MCG/ACT inhaler, Inhale 1-2 puffs into the lungs every 6 (six) hours as needed for wheezing or shortness of breath., Disp: 6.7 g, Rfl: 11 .  budesonide-formoterol (SYMBICORT) 160-4.5 MCG/ACT inhaler, Inhale 2 puffs into the lungs 2 (two) times daily., Disp: 3 Inhaler, Rfl: 3 .  calcium carbonate (TUMS - DOSED IN MG ELEMENTAL CALCIUM) 500 MG chewable tablet, Chew 1 tablet by mouth as needed for indigestion or heartburn., Disp: , Rfl:  .  omeprazole (PRILOSEC) 40 MG capsule, Take 1 capsule (40 mg total) by mouth daily., Disp: 30 capsule, Rfl: 11 .  Spacer/Aero-Holding Chambers (AEROCHAMBER MV) inhaler, Use as instructed, Disp: 1 each, Rfl: 0 .  lisinopril (ZESTRIL) 10 MG tablet, TAKE 1 TABLET BY MOUTH EVERY DAY (Patient not taking: Reported on 11/19/2019), Disp: 30 tablet, Rfl: 3    Steffanie Dunn, DO [facility] 11/19/2019 11:48 AM

## 2019-11-19 NOTE — Patient Instructions (Addendum)
Thank you for visiting Dr. Chestine Spore at [facility]. We recommend the following:   Meds ordered this encounter  Medications  . albuterol (VENTOLIN HFA) 108 (90 Base) MCG/ACT inhaler    Sig: Inhale 1-2 puffs into the lungs every 6 (six) hours as needed for wheezing or shortness of breath.    Dispense:  6.7 g    Refill:  11  . budesonide-formoterol (SYMBICORT) 160-4.5 MCG/ACT inhaler    Sig: Inhale 2 puffs into the lungs 2 (two) times daily.    Dispense:  3 Inhaler    Refill:  3    Return in about 1 year (around 11/18/2020).    Please do your part to reduce the spread of COVID-19.

## 2019-11-23 ENCOUNTER — Other Ambulatory Visit: Payer: Self-pay

## 2019-11-24 ENCOUNTER — Encounter: Payer: Self-pay | Admitting: Family Medicine

## 2019-11-24 ENCOUNTER — Ambulatory Visit (INDEPENDENT_AMBULATORY_CARE_PROVIDER_SITE_OTHER): Payer: Federal, State, Local not specified - PPO | Admitting: Family Medicine

## 2019-11-24 VITALS — BP 130/72 | HR 90 | Temp 97.5°F | Wt 199.6 lb

## 2019-11-24 DIAGNOSIS — K222 Esophageal obstruction: Secondary | ICD-10-CM

## 2019-11-24 DIAGNOSIS — R1310 Dysphagia, unspecified: Secondary | ICD-10-CM

## 2019-11-24 DIAGNOSIS — K219 Gastro-esophageal reflux disease without esophagitis: Secondary | ICD-10-CM | POA: Diagnosis not present

## 2019-11-24 DIAGNOSIS — Z0000 Encounter for general adult medical examination without abnormal findings: Secondary | ICD-10-CM

## 2019-11-24 DIAGNOSIS — L309 Dermatitis, unspecified: Secondary | ICD-10-CM | POA: Insufficient documentation

## 2019-11-24 LAB — BASIC METABOLIC PANEL
BUN: 11 mg/dL (ref 6–23)
CO2: 30 mEq/L (ref 19–32)
Calcium: 9.7 mg/dL (ref 8.4–10.5)
Chloride: 100 mEq/L (ref 96–112)
Creatinine, Ser: 1.04 mg/dL (ref 0.40–1.50)
GFR: 93.18 mL/min (ref >60.00)
Glucose, Bld: 81 mg/dL (ref 70–99)
Potassium: 4.1 mEq/L (ref 3.5–5.1)
Sodium: 137 mEq/L (ref 135–145)

## 2019-11-24 LAB — CBC WITH DIFFERENTIAL/PLATELET
Basophils Absolute: 0 10*3/uL (ref 0.0–0.1)
Basophils Relative: 0.4 % (ref 0.0–3.0)
Eosinophils Absolute: 0.3 10*3/uL (ref 0.0–0.7)
Eosinophils Relative: 5.5 % — ABNORMAL HIGH (ref 0.0–5.0)
HCT: 44.2 % (ref 39.0–52.0)
Hemoglobin: 14.9 g/dL (ref 13.0–17.0)
Lymphocytes Relative: 26.4 % (ref 12.0–46.0)
Lymphs Abs: 1.3 10*3/uL (ref 0.7–4.0)
MCHC: 33.7 g/dL (ref 30.0–36.0)
MCV: 90.1 fl (ref 78.0–100.0)
Monocytes Absolute: 0.4 10*3/uL (ref 0.1–1.0)
Monocytes Relative: 7.7 % (ref 3.0–12.0)
Neutro Abs: 3 10*3/uL (ref 1.4–7.7)
Neutrophils Relative %: 60 % (ref 43.0–77.0)
Platelets: 276 10*3/uL (ref 150.0–400.0)
RBC: 4.91 Mil/uL (ref 4.22–5.81)
RDW: 13.7 % (ref 11.5–15.5)
WBC: 5.1 10*3/uL (ref 4.0–10.5)

## 2019-11-24 LAB — HEPATIC FUNCTION PANEL
ALT: 22 U/L (ref 0–53)
AST: 19 U/L (ref 0–37)
Albumin: 4.5 g/dL (ref 3.5–5.2)
Alkaline Phosphatase: 129 U/L — ABNORMAL HIGH (ref 39–117)
Bilirubin, Direct: 0.1 mg/dL (ref 0.0–0.3)
Total Bilirubin: 0.6 mg/dL (ref 0.2–1.2)
Total Protein: 7.8 g/dL (ref 6.0–8.3)

## 2019-11-24 LAB — PSA: PSA: 1.04 ng/mL (ref 0.10–4.00)

## 2019-11-24 LAB — LIPID PANEL
Cholesterol: 171 mg/dL (ref 0–200)
HDL: 49.3 mg/dL (ref >39.00)
LDL Cholesterol: 106 mg/dL — ABNORMAL HIGH (ref 0–99)
NonHDL: 122.11
Total CHOL/HDL Ratio: 3
Triglycerides: 82 mg/dL (ref 0.0–149.0)
VLDL: 16.4 mg/dL (ref 0.0–40.0)

## 2019-11-24 LAB — TSH: TSH: 2.08 u[IU]/mL (ref 0.35–4.50)

## 2019-11-24 MED ORDER — OMEPRAZOLE 40 MG PO CPDR: 40.0000 mg | DELAYED_RELEASE_CAPSULE | Freq: Every day | ORAL | 3 refills | Status: DC

## 2019-11-24 MED ORDER — HALOBETASOL PROPIONATE 0.05 % EX CREA: TOPICAL_CREAM | Freq: Two times a day (BID) | CUTANEOUS | 5 refills | Status: DC

## 2019-11-24 NOTE — Progress Notes (Signed)
Subjective:    Patient ID: Cole Chen, male    DOB: 1975/09/07, 44 y.o.   MRN: [id number]  HPI Here for a well exam. He feels great. He has been working with the Pulmonary clinic to get his asthma under control. He has been following a Weight [employer] diet with his wife and exercising, and he has lost 10 lbs since last year. This has enabled him to control his BP, and hs stopped taking Lisinopril several months ago. He asks me to check a spot on the left upper back that he noticed 3 months ago. This does not bother him.    Review of Systems  Constitutional: Negative.   HENT: Negative.   Eyes: Negative.   Respiratory: Negative.   Cardiovascular: Negative.   Gastrointestinal: Negative.   Genitourinary: Negative.   Musculoskeletal: Negative.   Skin: Negative.   Neurological: Negative.   Psychiatric/Behavioral: Negative.        Objective:   Physical Exam Constitutional:      General: He is not in acute distress.    Appearance: He is well-developed. He is not diaphoretic.  HENT:     Head: Normocephalic and atraumatic.     Right Ear: External ear normal.     Left Ear: External ear normal.     Nose: Nose normal.     Mouth/Throat:     Pharynx: No oropharyngeal exudate.  Eyes:     General: No scleral icterus.       Right eye: No discharge.        Left eye: No discharge.     Conjunctiva/sclera: Conjunctivae normal.     Pupils: Pupils are equal, round, and reactive to light.  Neck:     Thyroid: No thyromegaly.     Vascular: No JVD.     Trachea: No tracheal deviation.  Cardiovascular:     Rate and Rhythm: Normal rate and regular rhythm.     Heart sounds: Normal heart sounds. No murmur. No friction rub. No gallop.   Pulmonary:     Effort: Pulmonary effort is normal. No respiratory distress.     Breath sounds: Normal breath sounds. No wheezing or rales.  Chest:     Chest wall: No tenderness.  Abdominal:     General: Bowel sounds are normal. There is no distension.   Palpations: Abdomen is soft. There is no mass.     Tenderness: There is no abdominal tenderness. There is no guarding or rebound.  Genitourinary:    Penis: Normal. No tenderness.      Testes: Normal.     Prostate: Normal.     Rectum: Normal. Guaiac result negative.  Musculoskeletal:        General: No tenderness. Normal range of motion.     Cervical back: Neck supple.  Lymphadenopathy:     Cervical: No cervical adenopathy.  Skin:    General: Skin is warm and dry.     Coloration: Skin is not pale.     Findings: No erythema or rash.     Comments: There is a small non-tender blackhead on the left upper back. The plug was extracted with gentle pressure   Neurological:     Mental Status: He is alert and oriented to person, place, and time.     Cranial Nerves: No cranial nerve deficit.     Motor: No abnormal muscle tone.     Coordination: Coordination normal.     Deep Tendon Reflexes: Reflexes are normal and symmetric. Reflexes normal.  Psychiatric:        Behavior: Behavior normal.        Thought Content: Thought content normal.        Judgment: Judgment normal.           Assessment & Plan:  Well exam. He is doing well, and we agreed to stay off Lisinopril. We discussed diet and exercise. Get fasting labs. He is scheduled for a colonoscopy through the [place] [facility] in August.  Gershon Crane, MD

## 2019-11-30 DIAGNOSIS — M0608 Rheumatoid arthritis without rheumatoid factor, vertebrae: Secondary | ICD-10-CM | POA: Diagnosis not present

## 2019-12-24 DIAGNOSIS — M0600 Rheumatoid arthritis without rheumatoid factor, unspecified site: Secondary | ICD-10-CM | POA: Diagnosis not present

## 2019-12-24 DIAGNOSIS — Z79899 Other long term (current) drug therapy: Secondary | ICD-10-CM | POA: Diagnosis not present

## 2020-03-01 DIAGNOSIS — Z01818 Encounter for other preprocedural examination: Secondary | ICD-10-CM | POA: Diagnosis not present

## 2020-03-01 DIAGNOSIS — Z1211 Encounter for screening for malignant neoplasm of colon: Secondary | ICD-10-CM | POA: Diagnosis not present

## 2020-03-16 DIAGNOSIS — H04123 Dry eye syndrome of bilateral lacrimal glands: Secondary | ICD-10-CM | POA: Diagnosis not present

## 2020-03-16 DIAGNOSIS — H527 Unspecified disorder of refraction: Secondary | ICD-10-CM | POA: Diagnosis not present

## 2020-03-31 DIAGNOSIS — G4733 Obstructive sleep apnea (adult) (pediatric): Secondary | ICD-10-CM | POA: Diagnosis not present

## 2020-04-12 DIAGNOSIS — G4733 Obstructive sleep apnea (adult) (pediatric): Secondary | ICD-10-CM | POA: Diagnosis not present

## 2020-06-02 DIAGNOSIS — M549 Dorsalgia, unspecified: Secondary | ICD-10-CM | POA: Diagnosis not present

## 2020-06-02 DIAGNOSIS — M79671 Pain in right foot: Secondary | ICD-10-CM | POA: Diagnosis not present

## 2020-06-02 DIAGNOSIS — Z23 Encounter for immunization: Secondary | ICD-10-CM | POA: Diagnosis not present

## 2020-06-02 DIAGNOSIS — M069 Rheumatoid arthritis, unspecified: Secondary | ICD-10-CM | POA: Diagnosis not present

## 2020-07-05 DIAGNOSIS — M069 Rheumatoid arthritis, unspecified: Secondary | ICD-10-CM | POA: Diagnosis not present

## 2020-07-05 DIAGNOSIS — M722 Plantar fascial fibromatosis: Secondary | ICD-10-CM | POA: Diagnosis not present

## 2020-07-18 DIAGNOSIS — Z1211 Encounter for screening for malignant neoplasm of colon: Secondary | ICD-10-CM | POA: Diagnosis not present

## 2020-07-18 HISTORY — PX: COLONOSCOPY: SHX174

## 2020-10-17 DIAGNOSIS — H527 Unspecified disorder of refraction: Secondary | ICD-10-CM | POA: Diagnosis not present

## 2020-10-17 DIAGNOSIS — H04123 Dry eye syndrome of bilateral lacrimal glands: Secondary | ICD-10-CM | POA: Diagnosis not present

## 2020-10-17 DIAGNOSIS — G43909 Migraine, unspecified, not intractable, without status migrainosus: Secondary | ICD-10-CM | POA: Diagnosis not present

## 2020-10-17 DIAGNOSIS — M069 Rheumatoid arthritis, unspecified: Secondary | ICD-10-CM | POA: Diagnosis not present

## 2020-10-28 ENCOUNTER — Ambulatory Visit: Payer: Federal, State, Local not specified - PPO | Admitting: Pulmonary Disease

## 2020-11-22 DIAGNOSIS — Z0120 Encounter for dental examination and cleaning without abnormal findings: Secondary | ICD-10-CM | POA: Diagnosis not present

## 2020-11-28 DIAGNOSIS — M19042 Primary osteoarthritis, left hand: Secondary | ICD-10-CM | POA: Diagnosis not present

## 2020-11-28 DIAGNOSIS — M069 Rheumatoid arthritis, unspecified: Secondary | ICD-10-CM | POA: Diagnosis not present

## 2020-12-07 ENCOUNTER — Ambulatory Visit: Payer: Federal, State, Local not specified - PPO | Admitting: Pulmonary Disease

## 2020-12-07 ENCOUNTER — Encounter: Payer: Self-pay | Admitting: Pulmonary Disease

## 2020-12-07 ENCOUNTER — Other Ambulatory Visit: Payer: Self-pay

## 2020-12-07 VITALS — BP 130/84 | HR 83 | Temp 97.3°F | Ht 71.0 in | Wt 199.0 lb

## 2020-12-07 DIAGNOSIS — J309 Allergic rhinitis, unspecified: Secondary | ICD-10-CM

## 2020-12-07 DIAGNOSIS — J4550 Severe persistent asthma, uncomplicated: Secondary | ICD-10-CM | POA: Diagnosis not present

## 2020-12-07 MED ORDER — BREZTRI AEROSPHERE 160-9-4.8 MCG/ACT IN AERO: 2.0000 | INHALATION_SPRAY | Freq: Two times a day (BID) | RESPIRATORY_TRACT | 0 refills | Status: DC

## 2020-12-07 MED ORDER — BREZTRI AEROSPHERE 160-9-4.8 MCG/ACT IN AERO: 2.0000 | INHALATION_SPRAY | Freq: Two times a day (BID) | RESPIRATORY_TRACT | 6 refills | Status: DC

## 2020-12-07 NOTE — Progress Notes (Signed)
Synopsis: Referred in 2018 for asthma by Nelwyn Salisbury, MD.  Formerly a patient of Dr. Kendrick Fries and Dr. Chestine Spore.  Subjective:   PATIENT ID: Cole Door GENDER: male DOB: 1976/05/30, MRN: [id number]  HPI  Chief Complaint  Patient presents with  . Follow-up    Allergies. Some wheezing. Taking zyrtec.    Cole Chen is a 45 year old male, never smoker with GERD and severe persistent asthma who returns to pulmonary clinic for follow up.   He reports he has been doing well since last visit 11/19/19 with Dr. Chestine Spore. He denies any need for prednisone over the past year or urgent care or ER visits. He is currently using symbicort 160-4.59mcg 2 puffs twice daily He reports he is having trouble with allergies at this time with itchy eyes and sinus congestion along with chest tightness. He is using albuterol as needed. He denies night time awakenings due to cough or dyspnea. He is working out regularly on [employer].    OV 11/19/19 Mr. Lingenfelter is a 45 year old gentleman with a history of severe persistent asthma who presents for follow-up.  He was diagnosed after working at [employer] in [place].  He required hospitalization in the ICU around the time of his original diagnosis.  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  He no longer works in [employer]; he currently works for the [employer] in [place].  He has had stable symptoms since his last follow-up year ago.  No antibiotics, prednisone, or asthma related urgent care/ED visits.  He has been using his albuterol when riding his bike for exercise recently for more shortness of breath, but using it prior to exercise has improved his symptoms.  He otherwise has not needed albuterol.  He remains on Symbicort twice daily.  He has chronic allergies which are well controlled with Zyrtec and Flonase.  He did not have childhood asthma, but he has a brother with asthma.  He never smoked.  No nocturnal symptoms, cough,  sputum production, wheezing.  He has received both Covid vaccines.  Past Medical History:  Diagnosis Date  . Asthma    sees Dr. Kendrick Fries   . GERD (gastroesophageal reflux disease)   . Sciatic nerve pain    involves right leg, sees Dr. Okey Dupre for chiropractic care   . Sleep apnea, obstructive    sees Dr. Delford Field      Family History  Problem Relation Age of Onset  . Cancer Mother        sarcoma  . Diabetes Paternal Grandfather   . Pancreatic cancer Paternal Grandfather   . Pancreatic cancer Father   . Colon cancer Neg Hx   . Esophageal cancer Neg Hx   . Stomach cancer Neg Hx   . Liver disease Neg Hx   . Kidney disease Neg Hx   . Heart disease Neg Hx      Social History   Socioeconomic History  . Marital status: Married    Spouse name: Not on file  . Number of children: Not on file  . Years of education: Not on file  . Highest education level: Not on file  Occupational History  . Not on file  Tobacco Use  . Smoking status: Never Smoker  . Smokeless tobacco: Never Used  Substance and Sexual Activity  . Alcohol use: Yes    Alcohol/week: 0.0 standard drinks    Comment: occasional  . Drug use: No  .  Sexual activity: Not on file  Other Topics Concern  . Not on file  Social History Narrative  . Not on file   Social Determinants of Health   Financial Resource Strain: Not on file  Food Insecurity: Not on file  Transportation Needs: Not on file  Physical Activity: Not on file  Stress: Not on file  Social Connections: Not on file  Intimate Partner Violence: Not on file     Allergies  Allergen Reactions  . Bismuth Subsalicylate     Blisters in mouth  . Grass Pollen(K-O-R-T-Swt Vern) Itching    All grasses, causes itching to eye, runny nose, itching in throat.     Outpatient Medications Prior to Visit  Medication Sig Dispense Refill  . albuterol (VENTOLIN HFA) 108 (90 Base) MCG/ACT inhaler Inhale 1-2 puffs into the lungs every 6 (six) hours as needed for  wheezing or shortness of breath. 6.7 Chen 11  . budesonide-formoterol (SYMBICORT) 160-4.5 MCG/ACT inhaler Inhale 2 puffs into the lungs 2 (two) times daily. 3 Inhaler 3  . halobetasol (ULTRAVATE) 0.05 % cream Apply topically 2 (two) times daily. 50 Chen 5  . omeprazole (PRILOSEC) 40 MG capsule Take 1 capsule (40 mg total) by mouth daily. 90 capsule 3  . Spacer/Aero-Holding Chambers (AEROCHAMBER MV) inhaler Use as instructed 1 each 0  . calcium carbonate (TUMS - DOSED IN MG ELEMENTAL CALCIUM) 500 MG chewable tablet Chew 1 tablet by mouth as needed for indigestion or heartburn. (Patient not taking: Reported on 12/07/2020)     No facility-administered medications prior to visit.    Review of Systems  Constitutional: Negative for chills, fever, malaise/fatigue and weight loss.  HENT: Positive for congestion. Negative for sinus pain and sore throat.   Eyes: Negative.   Respiratory: Positive for shortness of breath. Negative for cough, hemoptysis, sputum production and wheezing.   Cardiovascular: Negative for chest pain, palpitations, orthopnea, claudication and leg swelling.  Gastrointestinal: Negative for abdominal pain, heartburn, nausea and vomiting.  Genitourinary: Negative.   Musculoskeletal: Negative for joint pain and myalgias.  Skin: Negative for rash.  Neurological: Negative for weakness.  Endo/Heme/Allergies: Negative.   Psychiatric/Behavioral: Negative.     Objective:   Vitals:   12/07/20 0939  BP: 130/84  Pulse: 83  Temp: (!) 97.3 F (36.3 C)  SpO2: 98%  Weight: 199 lb (90.3 kg)  Height: 5\' 11"  (1.803 m)     Physical Exam Constitutional:      General: He is not in acute distress. HENT:     Head: Normocephalic and atraumatic.  Eyes:     Extraocular Movements: Extraocular movements intact.     Conjunctiva/sclera: Conjunctivae normal.     Pupils: Pupils are equal, round, and reactive to light.  Cardiovascular:     Rate and Rhythm: Normal rate and regular rhythm.      Pulses: Normal pulses.     Heart sounds: Normal heart sounds. No murmur heard.   Pulmonary:     Effort: Pulmonary effort is normal.     Breath sounds: Normal breath sounds.  Abdominal:     General: Bowel sounds are normal.     Palpations: Abdomen is soft.  Musculoskeletal:     Right lower leg: No edema.     Left lower leg: No edema.  Lymphadenopathy:     Cervical: No cervical adenopathy.  Skin:    General: Skin is warm and dry.  Neurological:     General: No focal deficit present.     Mental Status: He  is alert.  Psychiatric:        Mood and Affect: Mood normal.        Behavior: Behavior normal.        Thought Content: Thought content normal.        Judgment: Judgment normal.     CBC    Component Value Date/Time   WBC 5.1 11/24/2019 1115   RBC 4.91 11/24/2019 1115   HGB 14.9 11/24/2019 1115   HCT 44.2 11/24/2019 1115   PLT 276.0 11/24/2019 1115   MCV 90.1 11/24/2019 1115   MCHC 33.7 11/24/2019 1115   RDW 13.7 11/24/2019 1115   LYMPHSABS 1.3 11/24/2019 1115   MONOABS 0.4 11/24/2019 1115   EOSABS 0.3 11/24/2019 1115   BASOSABS 0.0 11/24/2019 1115   BMP Latest Ref Rng & Units 11/24/2019 12/13/2018 10/08/2018  Glucose 70 - 99 mg/dL 81 - 87  BUN 6 - 23 mg/dL 11 - 10  Creatinine 1.610.40 - 1.50 mg/dL 0.961.04 0.451.20 4.091.13  Sodium 135 - 145 mEq/L 137 138 139  Potassium 3.5 - 5.1 mEq/L 4.1 3.8 4.4  Chloride 96 - 112 mEq/L 100 - 102  CO2 19 - 32 mEq/L 30 - 30  Calcium 8.4 - 10.5 mg/dL 9.7 - 9.7   Chest imaging: CT Chest 2018: 1. No acute process in the chest. No dominant or suspicious pulmonary nodule identified. Minimal biapical pleural-parenchymal scarring could have accounted for the abnormality on outside CT. No outside report submitted. 2. Mild nonspecific peribronchovascular interstitial thickening is upper lobe predominant and could relate to asthma.  PFT: No flowsheet data found.   Spirometry 06/19/2017: FVC 2.8 (61%) FEV1 1.0 (27%) Ratio 35  Sleep Study  2012 Normal sleep architecture     Assessment & Plan:   Severe persistent asthma, unspecified whether complicated  Allergic rhinitis, unspecified seasonality, unspecified trigger  Discussion: Cole DoorCalvin Schnitker is a 45 year old male, never smoker with GERD and severe persistent asthma who returns to pulmonary clinic for follow up.   We will trial him on a sample of breztri at this time and see if he notices benefit when working out as he reports he occasionally feels limited while working out on [employer]. He is to call our office and let us know if the sample helped improve his dyspnea and we will send in a prescription. Otherwise he can resume his symbicort 160-4.5mch inhaler.   He can continue to use albuterol as needed. We can consider adding montelukast to his regimen in the future if needed.   Follow up in 1 year. Instructed patient to call us if he needs to be seen sooner.  Melody ComasJonathan Izik Bingman, MD [facility] Office: [phone]     Current Outpatient Medications:  .  albuterol (VENTOLIN HFA) 108 (90 Base) MCG/ACT inhaler, Inhale 1-2 puffs into the lungs every 6 (six) hours as needed for wheezing or shortness of breath., Disp: 6.7 Chen, Rfl: 11 .  Budeson-Glycopyrrol-Formoterol (BREZTRI AEROSPHERE) 160-9-4.8 MCG/ACT AERO, Inhale 2 puffs into the lungs in the morning and at bedtime., Disp: 4.8 Chen, Rfl: 0 .  budesonide-formoterol (SYMBICORT) 160-4.5 MCG/ACT inhaler, Inhale 2 puffs into the lungs 2 (two) times daily., Disp: 3 Inhaler, Rfl: 3 .  halobetasol (ULTRAVATE) 0.05 % cream, Apply topically 2 (two) times daily., Disp: 50 Chen, Rfl: 5 .  omeprazole (PRILOSEC) 40 MG capsule, Take 1 capsule (40 mg total) by mouth daily., Disp: 90 capsule, Rfl: 3 .  Spacer/Aero-Holding Chambers (AEROCHAMBER MV) inhaler, Use as instructed, Disp:  1 each, Rfl: 0 .  calcium carbonate (TUMS - DOSED IN MG ELEMENTAL CALCIUM) 500 MG chewable tablet, Chew 1 tablet by mouth as needed for  indigestion or heartburn. (Patient not taking: Reported on 12/07/2020), Disp: , Rfl:

## 2020-12-07 NOTE — Patient Instructions (Addendum)
Start Breztri inhaler 2 puffs twice daily with spacer  You can stop using Symbicort while trialing the [employer] inhaler.  You can use albuterol as needed 1-2 puffs every 4-6 hours for cough, dyspnea, wheezing or chest tightness.  Please call us if you notice benefit from the [place] and we will work to get it approved through [employer].

## 2021-01-04 ENCOUNTER — Encounter: Payer: Self-pay | Admitting: Pulmonary Disease

## 2021-01-04 ENCOUNTER — Telehealth: Payer: Self-pay | Admitting: Pulmonary Disease

## 2021-01-04 NOTE — Telephone Encounter (Signed)
Called and spoke with Patient.  Patient requested patient assistance information for [facility]  [employer] patient assistance mailed to patient.  Patient given instructions to complete and return to office.  Understanding stated.  Nothing further at this time.

## 2021-01-05 DIAGNOSIS — R609 Edema, unspecified: Secondary | ICD-10-CM | POA: Diagnosis not present

## 2021-01-05 DIAGNOSIS — M2550 Pain in unspecified joint: Secondary | ICD-10-CM | POA: Diagnosis not present

## 2021-01-05 DIAGNOSIS — M25532 Pain in left wrist: Secondary | ICD-10-CM | POA: Diagnosis not present

## 2021-01-05 DIAGNOSIS — M059 Rheumatoid arthritis with rheumatoid factor, unspecified: Secondary | ICD-10-CM | POA: Diagnosis not present

## 2021-03-01 DIAGNOSIS — M25532 Pain in left wrist: Secondary | ICD-10-CM | POA: Diagnosis not present

## 2021-04-05 ENCOUNTER — Other Ambulatory Visit: Payer: Self-pay

## 2021-04-05 ENCOUNTER — Encounter: Payer: Self-pay | Admitting: Family Medicine

## 2021-04-05 ENCOUNTER — Ambulatory Visit (INDEPENDENT_AMBULATORY_CARE_PROVIDER_SITE_OTHER): Payer: Federal, State, Local not specified - PPO | Admitting: Family Medicine

## 2021-04-05 VITALS — BP 128/98 | HR 84 | Temp 98.5°F | Ht 71.0 in | Wt 196.0 lb

## 2021-04-05 DIAGNOSIS — K222 Esophageal obstruction: Secondary | ICD-10-CM

## 2021-04-05 DIAGNOSIS — R1310 Dysphagia, unspecified: Secondary | ICD-10-CM | POA: Diagnosis not present

## 2021-04-05 DIAGNOSIS — Z0000 Encounter for general adult medical examination without abnormal findings: Secondary | ICD-10-CM

## 2021-04-05 DIAGNOSIS — Z125 Encounter for screening for malignant neoplasm of prostate: Secondary | ICD-10-CM | POA: Diagnosis not present

## 2021-04-05 DIAGNOSIS — K219 Gastro-esophageal reflux disease without esophagitis: Secondary | ICD-10-CM

## 2021-04-05 MED ORDER — OMEPRAZOLE 40 MG PO CPDR: 40.0000 mg | DELAYED_RELEASE_CAPSULE | Freq: Every day | ORAL | 3 refills | Status: DC

## 2021-04-05 MED ORDER — LISINOPRIL-HYDROCHLOROTHIAZIDE 10-12.5 MG PO TABS: 1.0000 | ORAL_TABLET | Freq: Every day | ORAL | 3 refills | Status: DC

## 2021-04-05 MED ORDER — DICLOFENAC SODIUM 75 MG PO TBEC: 75.0000 mg | DELAYED_RELEASE_TABLET | Freq: Two times a day (BID) | ORAL | 2 refills | Status: DC

## 2021-04-05 NOTE — Progress Notes (Signed)
Subjective:    Patient ID: Cole Chen, male    DOB: 1976/01/12, 45 y.o.   MRN: [id number]  HPI Here for a well exam. His only complaint is 6 months of pain in the left wrist. No hx of trauma. He does work on his computer all day. The pain does respond to Advil. Sometimes he feels a "pop" in the wrist. Also his BP has been running a little high for several months.    Review of Systems  Constitutional: Negative.   HENT: Negative.    Eyes: Negative.   Respiratory: Negative.    Cardiovascular: Negative.   Gastrointestinal: Negative.   Genitourinary: Negative.   Musculoskeletal:  Positive for arthralgias.  Skin: Negative.   Neurological: Negative.   Psychiatric/Behavioral: Negative.        Objective:   Physical Exam Constitutional:      General: He is not in acute distress.    Appearance: Normal appearance. He is well-developed. He is not diaphoretic.  HENT:     Head: Normocephalic and atraumatic.     Right Ear: External ear normal.     Left Ear: External ear normal.     Nose: Nose normal.     Mouth/Throat:     Pharynx: No oropharyngeal exudate.  Eyes:     General: No scleral icterus.       Right eye: No discharge.        Left eye: No discharge.     Conjunctiva/sclera: Conjunctivae normal.     Pupils: Pupils are equal, round, and reactive to light.  Neck:     Thyroid: No thyromegaly.     Vascular: No JVD.     Trachea: No tracheal deviation.  Cardiovascular:     Rate and Rhythm: Normal rate and regular rhythm.     Heart sounds: Normal heart sounds. No murmur heard.   No friction rub. No gallop.  Pulmonary:     Effort: Pulmonary effort is normal. No respiratory distress.     Breath sounds: Normal breath sounds. No wheezing or rales.  Chest:     Chest wall: No tenderness.  Abdominal:     General: Bowel sounds are normal. There is no distension.     Palpations: Abdomen is soft. There is no mass.     Tenderness: There is no abdominal tenderness. There is no guarding  or rebound.  Genitourinary:    Penis: Normal. No tenderness.      Testes: Normal.     Prostate: Normal.     Rectum: Normal. Guaiac result negative.  Musculoskeletal:        General: Normal range of motion.     Cervical back: Neck supple.     Comments: The left wrist has no swelling or warmth. He is mildly tender over the dorsal wrist. ROM is full  Lymphadenopathy:     Cervical: No cervical adenopathy.  Skin:    General: Skin is warm and dry.     Coloration: Skin is not pale.     Findings: No erythema or rash.  Neurological:     Mental Status: He is alert and oriented to person, place, and time.     Cranial Nerves: No cranial nerve deficit.     Motor: No abnormal muscle tone.     Coordination: Coordination normal.     Deep Tendon Reflexes: Reflexes are normal and symmetric. Reflexes normal.  Psychiatric:        Behavior: Behavior normal.        Thought  Content: Thought content normal.        Judgment: Judgment normal.          Assessment & Plan:  Well exam. We discussed diet and exercise. Get fasting labs. For the HTN, we will stop Lisinopril and start on Lisinopril HCT 10-12.5 daily. He will report back in 4 weeks. He has tendonitis in the left wrist. I explained this is an overuse injury so he is to rest it as much as possible. Apply ice 2-3 times a day. He will also try Diclofenac BID. Recheck if not better in 4 weeks.  Gershon Crane, MD

## 2021-04-06 ENCOUNTER — Other Ambulatory Visit: Payer: Federal, State, Local not specified - PPO

## 2021-04-06 LAB — CBC WITH DIFFERENTIAL/PLATELET
Basophils Absolute: 0 10*3/uL (ref 0.0–0.1)
Basophils Relative: 0.2 % (ref 0.0–3.0)
Eosinophils Absolute: 0.3 10*3/uL (ref 0.0–0.7)
Eosinophils Relative: 5.2 % — ABNORMAL HIGH (ref 0.0–5.0)
HCT: 42.5 % (ref 39.0–52.0)
Hemoglobin: 14.3 g/dL (ref 13.0–17.0)
Lymphocytes Relative: 31.8 % (ref 12.0–46.0)
Lymphs Abs: 1.7 10*3/uL (ref 0.7–4.0)
MCHC: 33.7 g/dL (ref 30.0–36.0)
MCV: 89.8 fl (ref 78.0–100.0)
Monocytes Absolute: 0.5 10*3/uL (ref 0.1–1.0)
Monocytes Relative: 8.9 % (ref 3.0–12.0)
Neutro Abs: 2.8 10*3/uL (ref 1.4–7.7)
Neutrophils Relative %: 53.9 % (ref 43.0–77.0)
Platelets: 274 10*3/uL (ref 150.0–400.0)
RBC: 4.73 Mil/uL (ref 4.22–5.81)
RDW: 13.4 % (ref 11.5–15.5)
WBC: 5.3 10*3/uL (ref 4.0–10.5)

## 2021-04-06 LAB — BASIC METABOLIC PANEL
BUN: 9 mg/dL (ref 6–23)
CO2: 30 mEq/L (ref 19–32)
Calcium: 9.4 mg/dL (ref 8.4–10.5)
Chloride: 102 mEq/L (ref 96–112)
Creatinine, Ser: 1.17 mg/dL (ref 0.40–1.50)
GFR: 74.5 mL/min (ref >60.00)
Glucose, Bld: 85 mg/dL (ref 70–99)
Potassium: 4.4 mEq/L (ref 3.5–5.1)
Sodium: 137 mEq/L (ref 135–145)

## 2021-04-06 LAB — HEPATIC FUNCTION PANEL
ALT: 26 U/L (ref 0–53)
AST: 22 U/L (ref 0–37)
Albumin: 4 g/dL (ref 3.5–5.2)
Alkaline Phosphatase: 105 U/L (ref 39–117)
Bilirubin, Direct: 0.1 mg/dL (ref 0.0–0.3)
Total Bilirubin: 0.4 mg/dL (ref 0.2–1.2)
Total Protein: 7.2 g/dL (ref 6.0–8.3)

## 2021-04-06 LAB — HEMOGLOBIN A1C: Hgb A1c MFr Bld: 5.5 % (ref 4.6–6.5)

## 2021-04-06 LAB — LIPID PANEL
Cholesterol: 174 mg/dL (ref 0–200)
HDL: 45.8 mg/dL (ref >39.00)
LDL Cholesterol: 110 mg/dL — ABNORMAL HIGH (ref 0–99)
NonHDL: 128.12
Total CHOL/HDL Ratio: 4
Triglycerides: 89 mg/dL (ref 0.0–149.0)
VLDL: 17.8 mg/dL (ref 0.0–40.0)

## 2021-04-06 LAB — T4, FREE: Free T4: 0.84 ng/dL (ref 0.60–1.60)

## 2021-04-06 LAB — PSA: PSA: 1.19 ng/mL (ref 0.10–4.00)

## 2021-04-06 LAB — T3, FREE: T3, Free: 2.9 pg/mL (ref 2.3–4.2)

## 2021-04-06 LAB — TSH: TSH: 5.11 u[IU]/mL (ref 0.35–5.50)

## 2021-04-06 NOTE — Addendum Note (Signed)
Addended by: Bonnye Fava on: 04/06/2021 08:43 AM   Modules accepted: Orders

## 2021-04-26 DIAGNOSIS — M25532 Pain in left wrist: Secondary | ICD-10-CM | POA: Diagnosis not present

## 2021-05-25 DIAGNOSIS — M059 Rheumatoid arthritis with rheumatoid factor, unspecified: Secondary | ICD-10-CM | POA: Diagnosis not present

## 2021-05-25 DIAGNOSIS — M25532 Pain in left wrist: Secondary | ICD-10-CM | POA: Diagnosis not present

## 2021-05-25 DIAGNOSIS — Z8709 Personal history of other diseases of the respiratory system: Secondary | ICD-10-CM | POA: Diagnosis not present

## 2021-05-25 DIAGNOSIS — Z79899 Other long term (current) drug therapy: Secondary | ICD-10-CM | POA: Diagnosis not present

## 2021-05-29 DIAGNOSIS — M069 Rheumatoid arthritis, unspecified: Secondary | ICD-10-CM | POA: Diagnosis not present

## 2021-05-29 DIAGNOSIS — J984 Other disorders of lung: Secondary | ICD-10-CM | POA: Diagnosis not present

## 2021-05-29 DIAGNOSIS — I10 Essential (primary) hypertension: Secondary | ICD-10-CM | POA: Diagnosis not present

## 2021-06-27 DIAGNOSIS — M25712 Osteophyte, left shoulder: Secondary | ICD-10-CM | POA: Diagnosis not present

## 2021-06-27 DIAGNOSIS — R937 Abnormal findings on diagnostic imaging of other parts of musculoskeletal system: Secondary | ICD-10-CM | POA: Diagnosis not present

## 2021-06-28 DIAGNOSIS — M25532 Pain in left wrist: Secondary | ICD-10-CM | POA: Diagnosis not present

## 2021-06-30 DIAGNOSIS — Z01812 Encounter for preprocedural laboratory examination: Secondary | ICD-10-CM | POA: Diagnosis not present

## 2021-07-05 ENCOUNTER — Other Ambulatory Visit: Payer: Self-pay | Admitting: Family Medicine

## 2021-07-27 DIAGNOSIS — M25532 Pain in left wrist: Secondary | ICD-10-CM | POA: Diagnosis not present

## 2021-07-27 DIAGNOSIS — M2559 Pain in other specified joint: Secondary | ICD-10-CM | POA: Diagnosis not present

## 2021-07-27 DIAGNOSIS — Z8709 Personal history of other diseases of the respiratory system: Secondary | ICD-10-CM | POA: Diagnosis not present

## 2021-10-29 ENCOUNTER — Other Ambulatory Visit: Payer: Self-pay | Admitting: Family Medicine

## 2021-11-13 ENCOUNTER — Ambulatory Visit: Payer: Federal, State, Local not specified - PPO | Admitting: Pulmonary Disease

## 2021-11-13 ENCOUNTER — Encounter: Payer: Self-pay | Admitting: Pulmonary Disease

## 2021-11-13 VITALS — BP 118/76 | HR 100 | Ht 71.0 in | Wt 195.4 lb

## 2021-11-13 DIAGNOSIS — J4550 Severe persistent asthma, uncomplicated: Secondary | ICD-10-CM

## 2021-11-13 MED ORDER — ALBUTEROL SULFATE HFA 108 (90 BASE) MCG/ACT IN AERS: 1.0000 | INHALATION_SPRAY | Freq: Four times a day (QID) | RESPIRATORY_TRACT | 11 refills | Status: DC | PRN

## 2021-11-13 MED ORDER — BREZTRI AEROSPHERE 160-9-4.8 MCG/ACT IN AERO: 2.0000 | INHALATION_SPRAY | Freq: Two times a day (BID) | RESPIRATORY_TRACT | 11 refills | Status: DC

## 2021-11-13 MED ORDER — MONTELUKAST SODIUM 10 MG PO TABS: 10.0000 mg | ORAL_TABLET | Freq: Every day | ORAL | 11 refills | Status: DC

## 2021-11-13 NOTE — Addendum Note (Signed)
Addended by: Maurene Capes on: 11/13/2021 10:09 AM ? ? Modules accepted: Orders ? ?

## 2021-11-13 NOTE — Progress Notes (Signed)
? ?Synopsis: Referred in 2018 for asthma by Nelwyn Salisbury, MD.  Formerly a patient of Dr. Kendrick Fries and Dr. Chestine Spore. ? ?Subjective:  ? ?PATIENT ID: Cole Chen GENDER: male DOB: 09-30-1975, MRN: [id number] ? ?HPI ? ?Chief Complaint  ?Patient presents with  ? Follow-up  ?  49yr f/u for asthma. States the Ragland inhaler is still working for him.   ? ?Cole Chen is a 46 year old male, never smoker with GERD and severe persistent asthma who returns to pulmonary clinic for follow up.  ? ?He has been using breztri 2 puffs twice daily since last visit with improvement in his breathing and is rarely using albuterol now. He has not had any urgent care or ER visits or need for prednisone in the last year. ? ?Spring allergies are usually is hardest time of year. He is able to complete all physical activities that he would like and does not notice any limitations to his work out regimen. ? ?12/07/20 ?He reports he has been doing well since last visit 11/19/19 with Dr. Chestine Spore. He denies any need for prednisone over the past year or urgent care or ER visits. He is currently using symbicort 160-4.85mcg 2 puffs twice daily He reports he is having trouble with allergies at this time with itchy eyes and sinus congestion along with chest tightness. He is using albuterol as needed. He denies night time awakenings due to cough or dyspnea. He is working out regularly on [employer].  ? ? ?OV 11/19/19 ?Cole Chen is a 46 year old gentleman with a history of severe persistent asthma who presents for follow-up.  He was diagnosed after working at [employer] in [place].  He required hospitalization in the ICU around the time of his original diagnosis.  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  He no longer works in [employer]; he currently works for the [employer] in [place].  He has had stable symptoms since his last follow-up year ago.  No antibiotics, prednisone, or asthma related urgent care/ED  visits.  He has been using his albuterol when riding his bike for exercise recently for more shortness of breath, but using it prior to exercise has improved his symptoms.  He otherwise has not needed albuterol.  He remains on Symbicort twice daily.  He has chronic allergies which are well controlled with Zyrtec and Flonase.  He did not have childhood asthma, but he has a brother with asthma.  He never smoked.  No nocturnal symptoms, cough, sputum production, wheezing.  He has received both Covid vaccines. ? ?Past Medical History:  ?Diagnosis Date  ? Asthma   ? sees Dr. Kendrick Fries   ? GERD (gastroesophageal reflux disease)   ? Sciatic nerve pain   ? involves right leg, sees Dr. Okey Dupre for chiropractic care   ? Sleep apnea, obstructive   ? sees Dr. Delford Field   ?  ? ?Family History  ?Problem Relation Age of Onset  ? Cancer Mother   ?     sarcoma  ? Diabetes Paternal Grandfather   ? Pancreatic cancer Paternal Grandfather   ? Pancreatic cancer Father   ? Colon cancer Neg Hx   ? Esophageal cancer Neg Hx   ? Stomach cancer Neg Hx   ? Liver disease Neg Hx   ? Kidney disease Neg Hx   ? Heart disease Neg Hx   ?  ? ?Social History  ? ?Socioeconomic History  ? Marital  status: Married  ?  Spouse name: Not on file  ? Number of children: Not on file  ? Years of education: Not on file  ? Highest education level: Not on file  ?Occupational History  ? Not on file  ?Tobacco Use  ? Smoking status: Never  ? Smokeless tobacco: Never  ?Substance and Sexual Activity  ? Alcohol use: Yes  ?  Alcohol/week: 0.0 standard drinks  ?  Comment: occasional  ? Drug use: No  ? Sexual activity: Not on file  ?Other Topics Concern  ? Not on file  ?Social History Narrative  ? Not on file  ? ?Social Determinants of Health  ? ?Financial Resource Strain: Not on file  ?Food Insecurity: Not on file  ?Transportation Needs: Not on file  ?Physical Activity: Not on file  ?Stress: Not on file  ?Social Connections: Not on file  ?Intimate Partner Violence: Not on file   ?  ? ?Allergies  ?Allergen Reactions  ? Bismuth Subsalicylate   ?  Blisters in mouth  ? Grass Pollen(K-O-R-T-Swt Vern) Itching  ?  All grasses, causes itching to eye, runny nose, itching in throat.  ?  ? ?Outpatient Medications Prior to Visit  ?Medication Sig Dispense Refill  ? albuterol (VENTOLIN HFA) 108 (90 Base) MCG/ACT inhaler Inhale 1-2 puffs into the lungs every 6 (six) hours as needed for wheezing or shortness of breath. 6.7 g 11  ? Budeson-Glycopyrrol-Formoterol (BREZTRI AEROSPHERE) 160-9-4.8 MCG/ACT AERO Inhale 2 puffs into the lungs in the morning and at bedtime. 4.8 g 0  ? diclofenac (VOLTAREN) 75 MG EC tablet TAKE 1 TABLET BY MOUTH TWICE A DAY 60 tablet 2  ? halobetasol (ULTRAVATE) 0.05 % cream Apply topically 2 (two) times daily. 50 g 5  ? lisinopril-hydrochlorothiazide (ZESTORETIC) 10-12.5 MG tablet Take 1 tablet by mouth daily. 90 tablet 3  ? omeprazole (PRILOSEC) 40 MG capsule Take 1 capsule (40 mg total) by mouth daily. 90 capsule 3  ? Spacer/Aero-Holding Chambers (AEROCHAMBER MV) inhaler Use as instructed 1 each 0  ? budesonide-formoterol (SYMBICORT) 160-4.5 MCG/ACT inhaler Inhale 2 puffs into the lungs 2 (two) times daily. 3 Inhaler 3  ? calcium carbonate (TUMS - DOSED IN MG ELEMENTAL CALCIUM) 500 MG chewable tablet Chew 1 tablet by mouth as needed for indigestion or heartburn.    ? ?No facility-administered medications prior to visit.  ? ? ?Review of Systems  ?Constitutional:  Negative for chills, fever, malaise/fatigue and weight loss.  ?HENT:  Negative for congestion, sinus pain and sore throat.   ?Eyes: Negative.   ?Respiratory:  Negative for cough, hemoptysis, sputum production, shortness of breath and wheezing.   ?Cardiovascular:  Negative for chest pain, palpitations, orthopnea, claudication and leg swelling.  ?Gastrointestinal:  Negative for abdominal pain, heartburn, nausea and vomiting.  ?Genitourinary: Negative.   ?Musculoskeletal:  Negative for joint pain and myalgias.  ?Skin:   Negative for rash.  ?Neurological:  Negative for weakness.  ?Endo/Heme/Allergies: Negative.   ?Psychiatric/Behavioral: Negative.    ? ?Objective:  ? ?Vitals:  ? 11/13/21 0948  ?BP: 118/76  ?Pulse: 100  ?SpO2: 98%  ?Weight: 195 lb 6.4 oz (88.6 kg)  ?Height: 5\' 11"  (1.803 m)  ? ? ? ?Physical Exam ?Constitutional:   ?   General: He is not in acute distress. ?HENT:  ?   Head: Normocephalic and atraumatic.  ?Eyes:  ?   Conjunctiva/sclera: Conjunctivae normal.  ?Cardiovascular:  ?   Rate and Rhythm: Normal rate and regular rhythm.  ?   Pulses:  Normal pulses.  ?   Heart sounds: Normal heart sounds. No murmur heard. ?Pulmonary:  ?   Effort: Pulmonary effort is normal.  ?   Breath sounds: Normal breath sounds.  ?Musculoskeletal:  ?   Right lower leg: No edema.  ?   Left lower leg: No edema.  ?Skin: ?   General: Skin is warm and dry.  ?Neurological:  ?   General: No focal deficit present.  ?   Mental Status: He is alert.  ?Psychiatric:     ?   Mood and Affect: Mood normal.     ?   Behavior: Behavior normal.     ?   Thought Content: Thought content normal.     ?   Judgment: Judgment normal.  ? ? ?CBC ?   ?Component Value Date/Time  ? WBC 5.3 04/06/2021 0843  ? RBC 4.73 04/06/2021 0843  ? HGB 14.3 04/06/2021 0843  ? HCT 42.5 04/06/2021 0843  ? PLT 274.0 04/06/2021 0843  ? MCV 89.8 04/06/2021 0843  ? MCHC 33.7 04/06/2021 0843  ? RDW 13.4 04/06/2021 0843  ? LYMPHSABS 1.7 04/06/2021 0843  ? MONOABS 0.5 04/06/2021 0843  ? EOSABS 0.3 04/06/2021 0843  ? BASOSABS 0.0 04/06/2021 0843  ? ? ?  Latest Ref Rng & Units 04/06/2021  ?  8:43 AM 11/24/2019  ? 11:15 AM 12/13/2018  ?  1:37 AM  ?BMP  ?Glucose 70 - 99 mg/dL 85   81     ?BUN 6 - 23 mg/dL 9   11     ?Creatinine 0.40 - 1.50 mg/dL 2.68   3.41     ?Sodium 135 - 145 mEq/L 137   137   138    ?Potassium 3.5 - 5.1 mEq/L 4.4   4.1   3.8    ?Chloride 96 - 112 mEq/L 102   100     ?CO2 19 - 32 mEq/L 30   30     ?Calcium 8.4 - 10.5 mg/dL 9.4   9.7     ? ?Chest imaging: ?CT Chest 2018: ?1. No acute  process in the chest. No dominant or suspicious ?pulmonary nodule identified. Minimal biapical pleural-parenchymal ?scarring could have accounted for the abnormality on outside CT. No ?outside report submitte

## 2021-11-13 NOTE — Patient Instructions (Signed)
Continue Breztri inhaler 2 puffs twice daily ?- rinse mouth out after each use ? ?Use albuterol inhaler 1-2 puffs every 4-6 hours as needed ? ?Start montelukast 10mg  daily for asthma/allergies ? ?Follow up in 1 year with pulmonary function tests ?

## 2021-11-27 DIAGNOSIS — M25512 Pain in left shoulder: Secondary | ICD-10-CM | POA: Diagnosis not present

## 2021-11-27 DIAGNOSIS — M25532 Pain in left wrist: Secondary | ICD-10-CM | POA: Diagnosis not present

## 2021-12-07 ENCOUNTER — Other Ambulatory Visit: Payer: Self-pay | Admitting: Pulmonary Disease

## 2022-02-15 DIAGNOSIS — H2513 Age-related nuclear cataract, bilateral: Secondary | ICD-10-CM | POA: Diagnosis not present

## 2022-02-15 DIAGNOSIS — Z79899 Other long term (current) drug therapy: Secondary | ICD-10-CM | POA: Diagnosis not present

## 2022-02-15 DIAGNOSIS — H04123 Dry eye syndrome of bilateral lacrimal glands: Secondary | ICD-10-CM | POA: Diagnosis not present

## 2022-02-15 DIAGNOSIS — H524 Presbyopia: Secondary | ICD-10-CM | POA: Diagnosis not present

## 2022-02-26 ENCOUNTER — Other Ambulatory Visit: Payer: Self-pay

## 2022-02-26 ENCOUNTER — Emergency Department (HOSPITAL_BASED_OUTPATIENT_CLINIC_OR_DEPARTMENT_OTHER)
Admission: EM | Admit: 2022-02-26 | Discharge: 2022-02-26 | Disposition: A | Discharge status: Home / Self Care | Payer: Federal, State, Local not specified - PPO | Attending: Emergency Medicine | Admitting: Emergency Medicine

## 2022-02-26 ENCOUNTER — Encounter (HOSPITAL_BASED_OUTPATIENT_CLINIC_OR_DEPARTMENT_OTHER): Payer: Self-pay | Admitting: Obstetrics and Gynecology

## 2022-02-26 DIAGNOSIS — S1086XA Insect bite of other specified part of neck, initial encounter: Secondary | ICD-10-CM | POA: Diagnosis not present

## 2022-02-26 DIAGNOSIS — S1096XA Insect bite of unspecified part of neck, initial encounter: Secondary | ICD-10-CM | POA: Diagnosis not present

## 2022-02-26 DIAGNOSIS — J45909 Unspecified asthma, uncomplicated: Secondary | ICD-10-CM | POA: Diagnosis not present

## 2022-02-26 DIAGNOSIS — W57XXXA Bitten or stung by nonvenomous insect and other nonvenomous arthropods, initial encounter: Secondary | ICD-10-CM | POA: Insufficient documentation

## 2022-02-26 MED ORDER — MUPIROCIN CALCIUM 2 % EX CREA: 1.0000 | TOPICAL_CREAM | Freq: Two times a day (BID) | CUTANEOUS | 0 refills | Status: AC

## 2022-02-26 NOTE — ED Triage Notes (Signed)
Patient reports he walked through a spider web this morning and saw a little spider on his skin and flicked it off. Patient reports he felt a stinging/tingling sensation in the back of his neck going down the left shoulder. Patient reports he believes he was bitten by the spider

## 2022-02-26 NOTE — ED Provider Notes (Signed)
Emergency Department Provider Note   I have reviewed the triage vital signs and the nursing notes.   HISTORY  Chief Complaint Insect Bite   HPI Cole Chen is a 46 y.o. male with past history reviewed below presents emergency department with bite/sting to the left upper back.  Patient was in his yard working this morning when he walked through a spiderweb.  He did see a spider on his pant leg that he brushed off and shortly afterward felt a sting or bite to the left upper back.  He has felt some swelling in the area along with pins-and-needles type tingling.  His wife circled an area on the neck that seemed to be the maximum area of pain.  No lip or tongue swelling.  No shortness of breath or drooling.  No diffuse rash.    Past Medical History:  Diagnosis Date   Asthma    sees Dr. Kendrick Fries    GERD (gastroesophageal reflux disease)    Sciatic nerve pain    involves right leg, sees Dr. Okey Dupre for chiropractic care    Sleep apnea, obstructive    sees Dr. Delford Field     Review of Systems  Constitutional: No fever/chills Eyes: No visual changes. ENT: No sore throat. Cardiovascular: Denies chest pain. Respiratory: Denies shortness of breath. Gastrointestinal: No abdominal pain.  No nausea, no vomiting.  No diarrhea.  No constipation. Genitourinary: Negative for dysuria. Musculoskeletal: Negative for back pain. Skin: Bite/sting to the left posterior shoulder.   ____________________________________________   PHYSICAL EXAM:  VITAL SIGNS: ED Triage Vitals [02/26/22 0920]  Enc Vitals Group     BP 129/87     Pulse Rate 98     Resp 16     Temp 98.8 F (37.1 C)     Temp Source Oral     SpO2 99 %     Weight 195 lb (88.5 kg)     Height 5\' 11"  (1.803 m)   Constitutional: Alert and oriented. Well appearing and in no acute distress. Eyes: Conjunctivae are normal.  Head: Atraumatic. Nose: No congestion/rhinnorhea. Mouth/Throat: Mucous membranes are moist. Neck: No  stridor.   Cardiovascular: Normal rate, regular rhythm.  Respiratory: Normal respiratory effort.  Gastrointestinal: No distention.  Musculoskeletal: No gross deformities of extremities. Neurologic:  Normal speech and language.  Skin: Tiny, punctate area of erythema to the posterior left neck/trapezius area.  There is some minimal swelling to this area but no ulceration.   ____________________________________________   PROCEDURES  Procedure(s) performed:   Procedures  None  ____________________________________________   INITIAL IMPRESSION / ASSESSMENT AND PLAN / ED COURSE  Pertinent labs & imaging results that were available during my care of the patient were reviewed by me and considered in my medical decision making (see chart for details).    Medical Decision Making: Summary:  Patient presents emergency department with insect sting/bite to the posterior left shoulder.  This does not appear secondarily infected and just happened prior to arrival.  No concern for anaphylaxis clinically.  Plan for ice/compression.  Patient to take Tylenol and/or Motrin as needed for discomfort.  We will send a topical antibiotic home with the patient as a watch and wait Rx.   Disposition: discharge  ____________________________________________  FINAL CLINICAL IMPRESSION(S) / ED DIAGNOSES  Final diagnoses:  Insect bite of other part of neck, initial encounter     NEW OUTPATIENT MEDICATIONS STARTED DURING THIS VISIT:  New Prescriptions   MUPIROCIN CREAM (BACTROBAN) 2 %    Apply  1 Application topically 2 (two) times daily for 7 days.    Note:  This document was prepared using Dragon voice recognition software and may include unintentional dictation errors.  Alona Bene, MD, [facility] Emergency Medicine    Raquan Iannone, Arlyss Repress, MD 02/26/22 [phone]

## 2022-02-26 NOTE — Discharge Instructions (Signed)
You were seen in the emergency room today with insect bite to the posterior left neck.  As we discussed, please apply cool compress to help reduce swelling.  He may take Tylenol and/or ibuprofen as needed for discomfort.  I have sent you home with a watch and wait prescription for topical antibiotic to use if redness worsens over the next couple of days.

## 2022-05-23 DIAGNOSIS — K0510 Chronic gingivitis, plaque induced: Secondary | ICD-10-CM | POA: Diagnosis not present

## 2022-05-23 DIAGNOSIS — K027 Dental root caries: Secondary | ICD-10-CM | POA: Diagnosis not present

## 2022-05-23 DIAGNOSIS — K023 Arrested dental caries: Secondary | ICD-10-CM | POA: Diagnosis not present

## 2022-05-23 DIAGNOSIS — K036 Deposits [accretions] on teeth: Secondary | ICD-10-CM | POA: Diagnosis not present

## 2022-05-28 DIAGNOSIS — M0680 Other specified rheumatoid arthritis, unspecified site: Secondary | ICD-10-CM | POA: Diagnosis not present

## 2022-05-28 DIAGNOSIS — M059 Rheumatoid arthritis with rheumatoid factor, unspecified: Secondary | ICD-10-CM | POA: Diagnosis not present

## 2022-05-28 DIAGNOSIS — K219 Gastro-esophageal reflux disease without esophagitis: Secondary | ICD-10-CM | POA: Diagnosis not present

## 2022-05-28 DIAGNOSIS — L309 Dermatitis, unspecified: Secondary | ICD-10-CM | POA: Diagnosis not present

## 2022-05-28 DIAGNOSIS — I10 Essential (primary) hypertension: Secondary | ICD-10-CM | POA: Diagnosis not present

## 2022-07-18 ENCOUNTER — Encounter: Payer: Self-pay | Admitting: Family Medicine

## 2022-07-18 ENCOUNTER — Ambulatory Visit (INDEPENDENT_AMBULATORY_CARE_PROVIDER_SITE_OTHER): Payer: Federal, State, Local not specified - PPO | Admitting: Family Medicine

## 2022-07-18 VITALS — BP 120/80 | HR 98 | Temp 98.6°F | Ht 71.0 in | Wt 190.5 lb

## 2022-07-18 DIAGNOSIS — Z0000 Encounter for general adult medical examination without abnormal findings: Secondary | ICD-10-CM

## 2022-07-18 DIAGNOSIS — K222 Esophageal obstruction: Secondary | ICD-10-CM

## 2022-07-18 DIAGNOSIS — K219 Gastro-esophageal reflux disease without esophagitis: Secondary | ICD-10-CM | POA: Diagnosis not present

## 2022-07-18 DIAGNOSIS — R1310 Dysphagia, unspecified: Secondary | ICD-10-CM

## 2022-07-18 LAB — CBC WITH DIFFERENTIAL/PLATELET
Basophils Absolute: 0 10*3/uL (ref 0.0–0.1)
Basophils Relative: 0.3 % (ref 0.0–3.0)
Eosinophils Absolute: 0.2 10*3/uL (ref 0.0–0.7)
Eosinophils Relative: 3.7 % (ref 0.0–5.0)
HCT: 46.5 % (ref 39.0–52.0)
Hemoglobin: 15.8 g/dL (ref 13.0–17.0)
Lymphocytes Relative: 25.4 % (ref 12.0–46.0)
Lymphs Abs: 1.6 10*3/uL (ref 0.7–4.0)
MCHC: 33.9 g/dL (ref 30.0–36.0)
MCV: 89.5 fl (ref 78.0–100.0)
Monocytes Absolute: 0.5 10*3/uL (ref 0.1–1.0)
Monocytes Relative: 8.2 % (ref 3.0–12.0)
Neutro Abs: 3.8 10*3/uL (ref 1.4–7.7)
Neutrophils Relative %: 62.4 % (ref 43.0–77.0)
Platelets: 347 10*3/uL (ref 150.0–400.0)
RBC: 5.19 Mil/uL (ref 4.22–5.81)
RDW: 13.7 % (ref 11.5–15.5)
WBC: 6.1 10*3/uL (ref 4.0–10.5)

## 2022-07-18 LAB — LIPID PANEL
Cholesterol: 201 mg/dL — ABNORMAL HIGH (ref 0–200)
HDL: 50.5 mg/dL (ref >39.00)
LDL Cholesterol: 133 mg/dL — ABNORMAL HIGH (ref 0–99)
NonHDL: 150.61
Total CHOL/HDL Ratio: 4
Triglycerides: 90 mg/dL (ref 0.0–149.0)
VLDL: 18 mg/dL (ref 0.0–40.0)

## 2022-07-18 LAB — HEPATIC FUNCTION PANEL
ALT: 22 U/L (ref 0–53)
AST: 20 U/L (ref 0–37)
Albumin: 4.7 g/dL (ref 3.5–5.2)
Alkaline Phosphatase: 122 U/L — ABNORMAL HIGH (ref 39–117)
Bilirubin, Direct: 0.1 mg/dL (ref 0.0–0.3)
Total Bilirubin: 0.7 mg/dL (ref 0.2–1.2)
Total Protein: 8.1 g/dL (ref 6.0–8.3)

## 2022-07-18 LAB — PSA: PSA: 1.68 ng/mL (ref 0.10–4.00)

## 2022-07-18 LAB — TSH: TSH: 3.34 u[IU]/mL (ref 0.35–5.50)

## 2022-07-18 LAB — BASIC METABOLIC PANEL
BUN: 12 mg/dL (ref 6–23)
CO2: 33 mEq/L — ABNORMAL HIGH (ref 19–32)
Calcium: 9.8 mg/dL (ref 8.4–10.5)
Chloride: 98 mEq/L (ref 96–112)
Creatinine, Ser: 1.27 mg/dL (ref 0.40–1.50)
GFR: 66.91 mL/min (ref >60.00)
Glucose, Bld: 79 mg/dL (ref 70–99)
Potassium: 4.3 mEq/L (ref 3.5–5.1)
Sodium: 136 mEq/L (ref 135–145)

## 2022-07-18 LAB — HEMOGLOBIN A1C: Hgb A1c MFr Bld: 5.6 % (ref 4.6–6.5)

## 2022-07-18 MED ORDER — HALOBETASOL PROPIONATE 0.05 % EX CREA: TOPICAL_CREAM | Freq: Two times a day (BID) | CUTANEOUS | 5 refills | Status: AC

## 2022-07-18 MED ORDER — LISINOPRIL-HYDROCHLOROTHIAZIDE 10-12.5 MG PO TABS: 1.0000 | ORAL_TABLET | Freq: Every day | ORAL | 3 refills | Status: AC

## 2022-07-18 MED ORDER — OMEPRAZOLE 40 MG PO CPDR: 40.0000 mg | DELAYED_RELEASE_CAPSULE | Freq: Every day | ORAL | 3 refills | Status: DC

## 2022-07-18 NOTE — Progress Notes (Signed)
   Subjective:    Patient ID: Cole Chen, male    DOB: Mar 28, 1976, 46 y.o.   MRN: [id number]  HPI Here for a well exam. He feels fine.    Review of Systems  Constitutional: Negative.   HENT: Negative.    Eyes: Negative.   Respiratory: Negative.    Cardiovascular: Negative.   Gastrointestinal: Negative.   Genitourinary: Negative.   Musculoskeletal: Negative.   Skin: Negative.   Neurological: Negative.   Psychiatric/Behavioral: Negative.         Objective:   Physical Exam Constitutional:      General: He is not in acute distress.    Appearance: Normal appearance. He is well-developed. He is not diaphoretic.  HENT:     Head: Normocephalic and atraumatic.     Right Ear: External ear normal.     Left Ear: External ear normal.     Nose: Nose normal.     Mouth/Throat:     Pharynx: No oropharyngeal exudate.  Eyes:     General: No scleral icterus.       Right eye: No discharge.        Left eye: No discharge.     Conjunctiva/sclera: Conjunctivae normal.     Pupils: Pupils are equal, round, and reactive to light.  Neck:     Thyroid: No thyromegaly.     Vascular: No JVD.     Trachea: No tracheal deviation.  Cardiovascular:     Rate and Rhythm: Normal rate and regular rhythm.     Heart sounds: Normal heart sounds. No murmur heard.    No friction rub. No gallop.  Pulmonary:     Effort: Pulmonary effort is normal. No respiratory distress.     Breath sounds: Normal breath sounds. No wheezing or rales.  Chest:     Chest wall: No tenderness.  Abdominal:     General: Bowel sounds are normal. There is no distension.     Palpations: Abdomen is soft. There is no mass.     Tenderness: There is no abdominal tenderness. There is no guarding or rebound.  Genitourinary:    Penis: Normal. No tenderness.      Testes: Normal.     Prostate: Normal.     Rectum: Normal. Guaiac result negative.  Musculoskeletal:        General: No tenderness. Normal range of motion.     Cervical  back: Neck supple.  Lymphadenopathy:     Cervical: No cervical adenopathy.  Skin:    General: Skin is warm and dry.     Coloration: Skin is not pale.     Findings: No erythema or rash.  Neurological:     Mental Status: He is alert and oriented to person, place, and time.     Cranial Nerves: No cranial nerve deficit.     Motor: No abnormal muscle tone.     Coordination: Coordination normal.     Deep Tendon Reflexes: Reflexes are normal and symmetric. Reflexes normal.  Psychiatric:        Behavior: Behavior normal.        Thought Content: Thought content normal.        Judgment: Judgment normal.           Assessment & Plan:  Well exam. We discussed diet and exercise. Get fasting labs. Gershon Crane, MD

## 2022-10-02 DIAGNOSIS — Z79899 Other long term (current) drug therapy: Secondary | ICD-10-CM | POA: Diagnosis not present

## 2022-10-02 DIAGNOSIS — M069 Rheumatoid arthritis, unspecified: Secondary | ICD-10-CM | POA: Diagnosis not present

## 2023-01-08 ENCOUNTER — Other Ambulatory Visit: Payer: Self-pay | Admitting: Pulmonary Disease

## 2023-01-08 DIAGNOSIS — J4550 Severe persistent asthma, uncomplicated: Secondary | ICD-10-CM

## 2023-01-11 ENCOUNTER — Telehealth: Payer: Self-pay | Admitting: Pulmonary Disease

## 2023-01-11 MED ORDER — BREZTRI AEROSPHERE 160-9-4.8 MCG/ACT IN AERO: 2.0000 | INHALATION_SPRAY | Freq: Two times a day (BID) | RESPIRATORY_TRACT | 0 refills | Status: DC

## 2023-01-11 NOTE — Telephone Encounter (Signed)
Pt called in because he has been trying to getting prescription refilled for breztri and pharmacy stated he needs a appt with the doctor. Pt is scheduled for appt on Aug 13th.  [facility] [address] [facility] [place]

## 2023-01-11 NOTE — Telephone Encounter (Signed)
Spoke to Cole Chen and resch his Aug appt to 01/15/2023 @ 2:15 PM with Dr Francine Graven also sent in breztri for just 1 month and informed Cole Chen he has to have an ov for more refills. Cole Chen verbalized understanding nothing further needed.

## 2023-01-15 ENCOUNTER — Ambulatory Visit: Payer: Federal, State, Local not specified - PPO | Admitting: Pulmonary Disease

## 2023-03-25 ENCOUNTER — Other Ambulatory Visit: Payer: Self-pay

## 2023-03-25 DIAGNOSIS — J4550 Severe persistent asthma, uncomplicated: Secondary | ICD-10-CM

## 2023-03-26 ENCOUNTER — Ambulatory Visit: Payer: Federal, State, Local not specified - PPO | Admitting: Pulmonary Disease

## 2023-03-26 ENCOUNTER — Ambulatory Visit (INDEPENDENT_AMBULATORY_CARE_PROVIDER_SITE_OTHER): Payer: Federal, State, Local not specified - PPO | Admitting: Pulmonary Disease

## 2023-03-26 DIAGNOSIS — J4550 Severe persistent asthma, uncomplicated: Secondary | ICD-10-CM

## 2023-03-26 NOTE — Patient Instructions (Signed)
Full PFT performed today. °

## 2023-03-26 NOTE — Progress Notes (Signed)
Full PFT performed today. °

## 2023-06-01 ENCOUNTER — Other Ambulatory Visit: Payer: Self-pay | Admitting: Pulmonary Disease

## 2023-06-06 ENCOUNTER — Telehealth: Payer: Self-pay | Admitting: Pulmonary Disease

## 2023-06-06 MED ORDER — BREZTRI AEROSPHERE 160-9-4.8 MCG/ACT IN AERO: 2.0000 | INHALATION_SPRAY | Freq: Two times a day (BID) | RESPIRATORY_TRACT | 0 refills | Status: DC

## 2023-06-06 NOTE — Telephone Encounter (Signed)
One month supply of Cole Chen has been sent to preferred pharmacy.  Left detailed message for patient and explained the need to keep scheduled visit for further refills. Nothing further needed.

## 2023-06-06 NOTE — Telephone Encounter (Signed)
PT needs Breztri refill. Has not been seen in a year. States he thought the PFT was good as a "appointment" with Dr. Evaristo Bury we do a courtesy refill? I did make appt with him.   [facility] [address]    [phone]

## 2023-06-12 DIAGNOSIS — M25542 Pain in joints of left hand: Secondary | ICD-10-CM | POA: Diagnosis not present

## 2023-06-12 DIAGNOSIS — Z79899 Other long term (current) drug therapy: Secondary | ICD-10-CM | POA: Diagnosis not present

## 2023-06-12 DIAGNOSIS — R760 Raised antibody titer: Secondary | ICD-10-CM | POA: Diagnosis not present

## 2023-06-12 DIAGNOSIS — M25541 Pain in joints of right hand: Secondary | ICD-10-CM | POA: Diagnosis not present

## 2023-06-17 DIAGNOSIS — H53149 Visual discomfort, unspecified: Secondary | ICD-10-CM | POA: Diagnosis not present

## 2023-06-17 DIAGNOSIS — H524 Presbyopia: Secondary | ICD-10-CM | POA: Diagnosis not present

## 2023-06-17 DIAGNOSIS — H5210 Myopia, unspecified eye: Secondary | ICD-10-CM | POA: Diagnosis not present

## 2023-06-17 DIAGNOSIS — H52203 Unspecified astigmatism, bilateral: Secondary | ICD-10-CM | POA: Diagnosis not present

## 2023-08-12 ENCOUNTER — Ambulatory Visit: Payer: Federal, State, Local not specified - PPO | Admitting: Pulmonary Disease

## 2023-08-12 ENCOUNTER — Telehealth: Payer: Self-pay | Admitting: Pulmonary Disease

## 2023-08-12 MED ORDER — ALBUTEROL SULFATE HFA 108 (90 BASE) MCG/ACT IN AERS: 1.0000 | INHALATION_SPRAY | Freq: Four times a day (QID) | RESPIRATORY_TRACT | 0 refills | Status: DC | PRN

## 2023-08-12 MED ORDER — BREZTRI AEROSPHERE 160-9-4.8 MCG/ACT IN AERO: 2.0000 | INHALATION_SPRAY | Freq: Two times a day (BID) | RESPIRATORY_TRACT | 1 refills | Status: DC

## 2023-08-12 NOTE — Telephone Encounter (Signed)
Pt calling in to get resch for appt due to Dr. Francine Graven being out of office today. Pt new appt is sch for 10/04/2023 at 1:15. Pt is needing a refill for Budeson-Glycopyrrol-Formoterol (BREZTRI AEROSPHERE) 160-9-4.8 MCG/ACT AERO  sent to his pharmacy. [id number] [place], [place] - [address]

## 2023-08-15 ENCOUNTER — Encounter: Payer: Federal, State, Local not specified - PPO | Admitting: Family Medicine

## 2023-09-10 ENCOUNTER — Encounter: Payer: Self-pay | Admitting: Family Medicine

## 2023-09-10 ENCOUNTER — Other Ambulatory Visit: Payer: Self-pay

## 2023-09-10 ENCOUNTER — Ambulatory Visit (INDEPENDENT_AMBULATORY_CARE_PROVIDER_SITE_OTHER): Payer: Federal, State, Local not specified - PPO | Admitting: Family Medicine

## 2023-09-10 VITALS — BP 120/78 | HR 89 | Temp 98.1°F | Wt 197.0 lb

## 2023-09-10 DIAGNOSIS — Z0000 Encounter for general adult medical examination without abnormal findings: Secondary | ICD-10-CM | POA: Diagnosis not present

## 2023-09-10 DIAGNOSIS — R1310 Dysphagia, unspecified: Secondary | ICD-10-CM

## 2023-09-10 DIAGNOSIS — K219 Gastro-esophageal reflux disease without esophagitis: Secondary | ICD-10-CM | POA: Diagnosis not present

## 2023-09-10 DIAGNOSIS — K222 Esophageal obstruction: Secondary | ICD-10-CM

## 2023-09-10 LAB — LIPID PANEL
Cholesterol: 202 mg/dL — ABNORMAL HIGH (ref 0–200)
HDL: 51.2 mg/dL (ref >39.00)
LDL Cholesterol: 132 mg/dL — ABNORMAL HIGH (ref 0–99)
NonHDL: 151.03
Total CHOL/HDL Ratio: 4
Triglycerides: 94 mg/dL (ref 0.0–149.0)
VLDL: 18.8 mg/dL (ref 0.0–40.0)

## 2023-09-10 LAB — BASIC METABOLIC PANEL
BUN: 16 mg/dL (ref 6–23)
CO2: 32 meq/L (ref 19–32)
Calcium: 9.7 mg/dL (ref 8.4–10.5)
Chloride: 100 meq/L (ref 96–112)
Creatinine, Ser: 1.12 mg/dL (ref 0.40–1.50)
GFR: 77.18 mL/min (ref >60.00)
Glucose, Bld: 93 mg/dL (ref 70–99)
Potassium: 4.2 meq/L (ref 3.5–5.1)
Sodium: 138 meq/L (ref 135–145)

## 2023-09-10 LAB — CBC WITH DIFFERENTIAL/PLATELET
Basophils Absolute: 0 10*3/uL (ref 0.0–0.1)
Basophils Relative: 0.3 % (ref 0.0–3.0)
Eosinophils Absolute: 0.4 10*3/uL (ref 0.0–0.7)
Eosinophils Relative: 6.7 % — ABNORMAL HIGH (ref 0.0–5.0)
HCT: 43.9 % (ref 39.0–52.0)
Hemoglobin: 14.7 g/dL (ref 13.0–17.0)
Lymphocytes Relative: 32.3 % (ref 12.0–46.0)
Lymphs Abs: 1.8 10*3/uL (ref 0.7–4.0)
MCHC: 33.5 g/dL (ref 30.0–36.0)
MCV: 90.6 fL (ref 78.0–100.0)
Monocytes Absolute: 0.5 10*3/uL (ref 0.1–1.0)
Monocytes Relative: 8.7 % (ref 3.0–12.0)
Neutro Abs: 2.8 10*3/uL (ref 1.4–7.7)
Neutrophils Relative %: 52 % (ref 43.0–77.0)
Platelets: 306 10*3/uL (ref 150.0–400.0)
RBC: 4.85 Mil/uL (ref 4.22–5.81)
RDW: 13.5 % (ref 11.5–15.5)
WBC: 5.5 10*3/uL (ref 4.0–10.5)

## 2023-09-10 LAB — HEPATIC FUNCTION PANEL
ALT: 22 U/L (ref 0–53)
AST: 21 U/L (ref 0–37)
Albumin: 4.5 g/dL (ref 3.5–5.2)
Alkaline Phosphatase: 106 U/L (ref 39–117)
Bilirubin, Direct: 0.1 mg/dL (ref 0.0–0.3)
Total Bilirubin: 0.5 mg/dL (ref 0.2–1.2)
Total Protein: 7.4 g/dL (ref 6.0–8.3)

## 2023-09-10 LAB — TSH: TSH: 3.51 u[IU]/mL (ref 0.35–5.50)

## 2023-09-10 LAB — HEMOGLOBIN A1C: Hgb A1c MFr Bld: 5.7 % (ref 4.6–6.5)

## 2023-09-10 LAB — PSA: PSA: 3.23 ng/mL (ref 0.10–4.00)

## 2023-09-10 MED ORDER — OMEPRAZOLE 40 MG PO CPDR: 40.0000 mg | DELAYED_RELEASE_CAPSULE | Freq: Every day | ORAL | 3 refills | Status: DC

## 2023-09-10 MED ORDER — OMEPRAZOLE 40 MG PO CPDR: 40.0000 mg | DELAYED_RELEASE_CAPSULE | Freq: Every day | ORAL | 3 refills | Status: AC

## 2023-09-10 NOTE — Progress Notes (Signed)
Subjective:    Patient ID: Aryn Kops, male    DOB: 18-Jun-1976, 48 y.o.   MRN: [id number]  HPI Here for a well exam. He feels great.    Review of Systems  Constitutional: Negative.   HENT: Negative.    Eyes: Negative.   Respiratory: Negative.    Cardiovascular: Negative.   Gastrointestinal: Negative.   Genitourinary: Negative.   Musculoskeletal: Negative.   Skin: Negative.   Neurological: Negative.   Psychiatric/Behavioral: Negative.         Objective:   Physical Exam Constitutional:      General: He is not in acute distress.    Appearance: Normal appearance. He is well-developed. He is not diaphoretic.  HENT:     Head: Normocephalic and atraumatic.     Right Ear: External ear normal.     Left Ear: External ear normal.     Nose: Nose normal.     Mouth/Throat:     Pharynx: No oropharyngeal exudate.  Eyes:     General: No scleral icterus.       Right eye: No discharge.        Left eye: No discharge.     Conjunctiva/sclera: Conjunctivae normal.     Pupils: Pupils are equal, round, and reactive to light.  Neck:     Thyroid: No thyromegaly.     Vascular: No JVD.     Trachea: No tracheal deviation.  Cardiovascular:     Rate and Rhythm: Normal rate and regular rhythm.     Pulses: Normal pulses.     Heart sounds: Normal heart sounds. No murmur heard.    No friction rub. No gallop.  Pulmonary:     Effort: Pulmonary effort is normal. No respiratory distress.     Breath sounds: Normal breath sounds. No wheezing or rales.  Chest:     Chest wall: No tenderness.  Abdominal:     General: Bowel sounds are normal. There is no distension.     Palpations: Abdomen is soft. There is no mass.     Tenderness: There is no abdominal tenderness. There is no guarding or rebound.  Genitourinary:    Penis: Normal. No tenderness.      Testes: Normal.     Prostate: Normal.     Rectum: Normal. Guaiac result negative.  Musculoskeletal:        General: No tenderness. Normal range  of motion.     Cervical back: Neck supple.  Lymphadenopathy:     Cervical: No cervical adenopathy.  Skin:    General: Skin is warm and dry.     Coloration: Skin is not pale.     Findings: No erythema or rash.  Neurological:     General: No focal deficit present.     Mental Status: He is alert and oriented to person, place, and time.     Cranial Nerves: No cranial nerve deficit.     Motor: No abnormal muscle tone.     Coordination: Coordination normal.     Deep Tendon Reflexes: Reflexes are normal and symmetric. Reflexes normal.  Psychiatric:        Mood and Affect: Mood normal.        Behavior: Behavior normal.        Thought Content: Thought content normal.        Judgment: Judgment normal.           Assessment & Plan:  Well exam. We discussed diet and exercise. Get fasting labs. Gershon Crane, MD

## 2023-09-11 ENCOUNTER — Encounter: Payer: Self-pay | Admitting: *Deleted

## 2023-10-03 ENCOUNTER — Ambulatory Visit: Payer: Federal, State, Local not specified - PPO | Admitting: Pulmonary Disease

## 2023-10-03 ENCOUNTER — Encounter: Payer: Self-pay | Admitting: Pulmonary Disease

## 2023-10-03 VITALS — BP 124/86 | HR 92 | Ht 71.0 in | Wt 196.4 lb

## 2023-10-03 DIAGNOSIS — R942 Abnormal results of pulmonary function studies: Secondary | ICD-10-CM | POA: Diagnosis not present

## 2023-10-03 DIAGNOSIS — J4550 Severe persistent asthma, uncomplicated: Secondary | ICD-10-CM

## 2023-10-03 MED ORDER — ALBUTEROL SULFATE HFA 108 (90 BASE) MCG/ACT IN AERS
1.0000 | INHALATION_SPRAY | Freq: Four times a day (QID) | RESPIRATORY_TRACT | 0 refills | Status: DC | PRN
Start: 2023-10-03 — End: 2023-11-11

## 2023-10-03 MED ORDER — BREZTRI AEROSPHERE 160-9-4.8 MCG/ACT IN AERO: 2.0000 | INHALATION_SPRAY | Freq: Two times a day (BID) | RESPIRATORY_TRACT | 11 refills | Status: DC

## 2023-10-03 NOTE — Patient Instructions (Signed)
 We will schedule you for CT Chest scan  Continue breztri inhaler 2 puffs twice daily - rinse mouth out after each use  Use albuterol inhaler 1-2 puffs every 4-6 hours as needed  Follow up in 3 months via virtual visit to review CT Chest scan

## 2023-10-03 NOTE — Progress Notes (Addendum)
 Synopsis: Referred in 2018 for asthma by Cole Salisbury, MD.  Formerly a patient of Dr. Kendrick Chen and Dr. Chestine Chen.  Subjective:   PATIENT ID: Cole Chen GENDER: male DOB: Nov 07, 1975, MRN: [id number]  HPI  Chief Complaint  Patient presents with   Follow-up    Pt states needs refills    Cole Chen is a 48 year old male, never smoker with GERD and severe persistent asthma who returns to pulmonary clinic for follow up.   He has been using Breztri, taking two puffs twice a day, and has not been using montelukast or Singulair since switching to Cole Chen. He rarely needs albuterol and has no nighttime awakenings due to shortness of breath or wheezing.   In August, pulmonary function tests indicated severe obstruction with an FEV1 at 29% of predicted and FVC at 48% of predicted. Total lung capacity was 77%, indicating a restrictive process. A CT chest scan from 2018 showed mild inflammation around the airways.   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 Prior to this, he was active in the [employer] without respiratory issues.   In January 2024, he had COVID-19, and a month ago, he contracted an illness after his wife had RSV. He reports returning to baseline after these infections.   He experiences limitations in physical activities such as basketball, where he feels short of breath with increased physical exertion. However, he manages daily activities without issues and can climb a flight of stairs without difficulty. He exercises on a Peloton for about 30 minutes without problems.    OV 11/13/21 He has been using breztri 2 puffs twice daily since last visit with improvement in his breathing and is rarely using albuterol now. He has not had any urgent care or ER visits or need for prednisone in the last year.  Spring allergies are usually is hardest time of year. He is able to complete all physical activities that he would like  and does not notice any limitations to his work out regimen.  12/07/20 He reports he has been doing well since last visit 11/19/19 with Dr. Chestine Chen. He denies any need for prednisone over the past year or urgent care or ER visits. He is currently using symbicort 160-4.15mcg 2 puffs twice daily He reports he is having trouble with allergies at this time with itchy eyes and sinus congestion along with chest tightness. He is using albuterol as needed. He denies night time awakenings due to cough or dyspnea. He is working out regularly on [employer].    OV 11/19/19 [name] is a 48 year old gentleman with a history of severe persistent asthma who presents for follow-up.  He was diagnosed after working at [employer] in [place].  He required hospitalization in the ICU around the time of his original diagnosis.  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  He no longer works in [employer]; he currently works for the [employer] in [place].  He has had stable symptoms since his last follow-up year ago.  No antibiotics, prednisone, or asthma related urgent care/ED visits.  He has been using his albuterol when riding his bike for exercise recently for more shortness of breath, but using it prior to exercise has improved his symptoms.  He otherwise has not needed albuterol.  He remains on Symbicort twice daily.  He has chronic allergies which are well controlled with Zyrtec  and Flonase.  He did not have childhood asthma, but he has a brother with asthma.  He never smoked.  No nocturnal symptoms, cough, sputum production, wheezing.  He has received both Covid vaccines.  Past Medical History:  Diagnosis Date   Asthma    sees Dr. Kendrick Chen    GERD (gastroesophageal reflux disease)    Sciatic nerve pain    involves right leg, sees Dr. Okey Dupre for chiropractic care    Sleep apnea, obstructive    sees Dr. Delford Field      Family History  Problem Relation Age of Onset   Cancer  Mother        sarcoma   Pancreatic cancer Father    Diabetes Paternal Grandfather    Pancreatic cancer Paternal Grandfather    Colon cancer Neg Hx    Esophageal cancer Neg Hx    Stomach cancer Neg Hx    Liver disease Neg Hx    Kidney disease Neg Hx    Heart disease Neg Hx      Social History   Socioeconomic History   Marital status: Married    Spouse name: Not on file   Number of children: Not on file   Years of education: Not on file   Highest education level: Bachelor's degree (e.g., BA, AB, BS)  Occupational History   Not on file  Tobacco Use   Smoking status: Never    Passive exposure: Past   Smokeless tobacco: Never  Vaping Use   Vaping status: Never Used  Substance and Sexual Activity   Alcohol use: Yes    Alcohol/week: 0.0 standard drinks of alcohol    Comment: occasional   Drug use: No   Sexual activity: Yes  Other Topics Concern   Not on file  Social History Narrative   Not on file   Social Drivers of Health   Financial Resource Strain: Low Risk  (09/09/2023)   Overall Financial Resource Strain (CARDIA)    Difficulty of Paying Living Expenses: Not hard at all  Food Insecurity: No Food Insecurity (09/09/2023)   Hunger Vital Sign    Worried About Running Out of Food in the Last Year: Never true    Ran Out of Food in the Last Year: Never true  Transportation Needs: No Transportation Needs (09/09/2023)   PRAPARE - Administrator, Civil Service (Medical): No    Lack of Transportation (Non-Medical): No  Physical Activity: Insufficiently Active (09/09/2023)   Exercise Vital Sign    Days of Exercise per Week: 2 days    Minutes of Exercise per Session: 40 min  Stress: Patient Declined (09/09/2023)   [employer] of Occupational Health - Occupational Stress Questionnaire    Feeling of Stress : Patient declined  Social Connections: Socially Integrated (09/09/2023)   Social Connection and Isolation Panel [NHANES]    Frequency of Communication with  Friends and Family: More than three times a week    Frequency of Social Gatherings with Friends and Family: Patient declined    Attends Religious Services: More than 4 times per year    Active Member of [employer] or Organizations: Yes    Attends Banker Meetings: 1 to 4 times per year    Marital Status: Married  Catering manager Violence: Not on file     Allergies  Allergen Reactions   Bismuth Subsalicylate     Blisters in mouth   Grass Pollen(K-O-R-T-Swt Vern) Itching    All grasses, causes itching to eye, runny nose,  itching in throat.     Outpatient Medications Prior to Visit  Medication Sig Dispense Refill   halobetasol (ULTRAVATE) 0.05 % cream Apply topically 2 (two) times daily. 50 g 5   lisinopril-hydrochlorothiazide (ZESTORETIC) 10-12.5 MG tablet Take 1 tablet by mouth daily. 90 tablet 3   omeprazole (PRILOSEC) 40 MG capsule Take 1 capsule (40 mg total) by mouth daily. 90 capsule 3   Spacer/Aero-Holding Chambers (AEROCHAMBER MV) inhaler Use as instructed 1 each 0   albuterol (VENTOLIN HFA) 108 (90 Base) MCG/ACT inhaler Inhale 1-2 puffs into the lungs every 6 (six) hours as needed for wheezing or shortness of breath. 18 g 0   Budeson-Glycopyrrol-Formoterol (BREZTRI AEROSPHERE) 160-9-4.8 MCG/ACT AERO Inhale 2 puffs into the lungs in the morning and at bedtime. 10.7 g 1   No facility-administered medications prior to visit.    Review of Systems  Constitutional:  Negative for chills, fever, malaise/fatigue and weight loss.  HENT:  Negative for congestion, sinus pain and sore throat.   Eyes: Negative.   Respiratory:  Negative for cough, hemoptysis, sputum production, shortness of breath and wheezing.   Cardiovascular:  Negative for chest pain, palpitations, orthopnea, claudication and leg swelling.  Gastrointestinal:  Negative for abdominal pain, heartburn, nausea and vomiting.  Genitourinary: Negative.   Musculoskeletal:  Negative for joint pain and myalgias.  Skin:   Negative for rash.  Neurological:  Negative for weakness.  Endo/Heme/Allergies: Negative.   Psychiatric/Behavioral: Negative.      Objective:   Vitals:   10/03/23 1306  BP: 124/86  Pulse: 92  SpO2: 96%  Weight: 196 lb 6.4 oz (89.1 kg)  Height: 5\' 11"  (1.803 m)      Physical Exam Constitutional:      General: He is not in acute distress. HENT:     Head: Normocephalic and atraumatic.  Eyes:     Conjunctiva/sclera: Conjunctivae normal.  Cardiovascular:     Rate and Rhythm: Normal rate and regular rhythm.     Pulses: Normal pulses.     Heart sounds: Normal heart sounds. No murmur heard. Pulmonary:     Effort: Pulmonary effort is normal.     Breath sounds: Normal breath sounds.  Musculoskeletal:     Right lower leg: No edema.     Left lower leg: No edema.  Skin:    General: Skin is warm and dry.  Neurological:     General: No focal deficit present.     Mental Status: He is alert.  Psychiatric:        Mood and Affect: Mood normal.        Behavior: Behavior normal.        Thought Content: Thought content normal.        Judgment: Judgment normal.     CBC    Component Value Date/Time   WBC 5.5 09/10/2023 0947   RBC 4.85 09/10/2023 0947   HGB 14.7 09/10/2023 0947   HCT 43.9 09/10/2023 0947   PLT 306.0 09/10/2023 0947   MCV 90.6 09/10/2023 0947   MCHC 33.5 09/10/2023 0947   RDW 13.5 09/10/2023 0947   LYMPHSABS 1.8 09/10/2023 0947   MONOABS 0.5 09/10/2023 0947   EOSABS 0.4 09/10/2023 0947   BASOSABS 0.0 09/10/2023 0947      Latest Ref Rng & Units 09/10/2023    9:47 AM 07/18/2022   10:47 AM 04/06/2021    8:43 AM  BMP  Glucose 70 - 99 mg/dL 93  79  85   BUN 6 - 23  mg/dL 16  12  9    Creatinine 0.40 - 1.50 mg/dL 1.61  0.96  0.45   Sodium 135 - 145 mEq/L 138  136  137   Potassium 3.5 - 5.1 mEq/L 4.2  4.3  4.4   Chloride 96 - 112 mEq/L 100  98  102   CO2 19 - 32 mEq/L 32  33  30   Calcium 8.4 - 10.5 mg/dL 9.7  9.8  9.4    Chest imaging: CT Chest 2018: 1.  No acute process in the chest. No dominant or suspicious pulmonary nodule identified. Minimal biapical pleural-parenchymal scarring could have accounted for the abnormality on outside CT. No outside report submitted. 2. Mild nonspecific peribronchovascular interstitial thickening is upper lobe predominant and could relate to asthma.  PFT:    Latest Ref Rng & Units 03/25/2023   10:29 AM  PFT Results  FVC-Pre L 2.61   FVC-Predicted Pre % 49   FVC-Post L 2.59   FVC-Predicted Post % 48   Pre FEV1/FVC % % 49   Post FEV1/FCV % % 47   FEV1-Pre L 1.27   FEV1-Predicted Pre % 30   FEV1-Post L 1.22   DLCO uncorrected ml/min/mmHg 33.01   DLCO UNC% % 107   DLCO corrected ml/min/mmHg 33.01   DLCO COR %Predicted % 107   DLVA Predicted % 162   TLC L 5.56   TLC % Predicted % 77   RV % Predicted % 132      Spirometry 06/19/2017: FVC 2.8 (61%) FEV1 1.0 (27%) Ratio 35  Sleep Study 2012 Normal sleep architecture     Assessment & Plan:   Severe persistent asthma without complication - Plan: Budeson-Glycopyrrol-Formoterol (BREZTRI AEROSPHERE) 160-9-4.8 MCG/ACT AERO, albuterol (VENTOLIN HFA) 108 (90 Base) MCG/ACT inhaler  Mixed obstructive and restrictive ventilatory defect - Plan: CT CHEST HIGH RESOLUTION  Discussion: Sarah Zerby is a 49 year old male, never smoker with GERD and severe persistent asthma who returns to pulmonary clinic for follow up.   Mixed Obstructive and Restrictive  Lung Disease Severe obstruction noted on PFTs with FEV1 at 29% of predicted and FVC at 48% of predicted. Restrictive pattern also noted with total lung capacity at 77% of predicted. Patient reports minimal symptoms with rare use of albuterol and no nocturnal symptoms. Patient is able to perform 30-minute workouts on Pel[employer] without issues.  -Continue Breztri two puffs twice daily.  -Order updated CT chest scan to assess for any changes since last scan in 2018. -Schedule follow-up in three months, with a  virtual visit to discuss CT scan results.   Melody Comas, MD [facility] Office: [phone]      Current Outpatient Medications:    halobetasol (ULTRAVATE) 0.05 % cream, Apply topically 2 (two) times daily., Disp: 50 g, Rfl: 5   lisinopril-hydrochlorothiazide (ZESTORETIC) 10-12.5 MG tablet, Take 1 tablet by mouth daily., Disp: 90 tablet, Rfl: 3   omeprazole (PRILOSEC) 40 MG capsule, Take 1 capsule (40 mg total) by mouth daily., Disp: 90 capsule, Rfl: 3   Spacer/Aero-Holding Chambers (AEROCHAMBER MV) inhaler, Use as instructed, Disp: 1 each, Rfl: 0   albuterol (VENTOLIN HFA) 108 (90 Base) MCG/ACT inhaler, Inhale 1-2 puffs into the lungs every 6 (six) hours as needed for wheezing or shortness of breath., Disp: 18 g, Rfl: 0   Budeson-Glycopyrrol-Formoterol (BREZTRI AEROSPHERE) 160-9-4.8 MCG/ACT AERO, Inhale 2 puffs into the lungs in the morning and at bedtime., Disp: 10.7 g, Rfl: 11

## 2023-10-28 DIAGNOSIS — Z79899 Other long term (current) drug therapy: Secondary | ICD-10-CM | POA: Diagnosis not present

## 2023-11-10 ENCOUNTER — Other Ambulatory Visit: Payer: Self-pay | Admitting: Pulmonary Disease

## 2023-11-10 DIAGNOSIS — J4550 Severe persistent asthma, uncomplicated: Secondary | ICD-10-CM

## 2023-12-02 ENCOUNTER — Ambulatory Visit (HOSPITAL_COMMUNITY)
Admission: RE | Admit: 2023-12-02 | Discharge: 2023-12-02 | Disposition: A | Discharge status: Home / Self Care | Source: Ambulatory Visit | Attending: Pulmonary Disease | Admitting: Pulmonary Disease

## 2023-12-02 DIAGNOSIS — R942 Abnormal results of pulmonary function studies: Secondary | ICD-10-CM | POA: Insufficient documentation

## 2023-12-02 DIAGNOSIS — R918 Other nonspecific abnormal finding of lung field: Secondary | ICD-10-CM | POA: Diagnosis not present

## 2023-12-18 DIAGNOSIS — R760 Raised antibody titer: Secondary | ICD-10-CM | POA: Diagnosis not present

## 2023-12-18 DIAGNOSIS — M25532 Pain in left wrist: Secondary | ICD-10-CM | POA: Diagnosis not present

## 2023-12-18 DIAGNOSIS — M79642 Pain in left hand: Secondary | ICD-10-CM | POA: Diagnosis not present

## 2023-12-18 DIAGNOSIS — Z79899 Other long term (current) drug therapy: Secondary | ICD-10-CM | POA: Diagnosis not present

## 2024-01-07 ENCOUNTER — Telehealth: Payer: Federal, State, Local not specified - PPO | Admitting: Pulmonary Disease

## 2024-01-07 DIAGNOSIS — K219 Gastro-esophageal reflux disease without esophagitis: Secondary | ICD-10-CM

## 2024-01-07 DIAGNOSIS — J4550 Severe persistent asthma, uncomplicated: Secondary | ICD-10-CM | POA: Diagnosis not present

## 2024-01-07 DIAGNOSIS — Z7722 Contact with and (suspected) exposure to environmental tobacco smoke (acute) (chronic): Secondary | ICD-10-CM | POA: Diagnosis not present

## 2024-01-07 DIAGNOSIS — R942 Abnormal results of pulmonary function studies: Secondary | ICD-10-CM

## 2024-01-07 NOTE — Progress Notes (Signed)
 Virtual Visit via Video Note  I connected with Lenell Query on 01/15/24 at  1:30 PM EDT by a video enabled telemedicine application and verified that I am speaking with the correct person using two identifiers.  Location: Patient: home Provider: clinic   I discussed the limitations of evaluation and management by telemedicine and the availability of in person appointments. The patient expressed understanding and agreed to proceed.  History of Present Illness: Cole Chen is a 48 year old male, never smoker with GERD and severe persistent asthma who returns to pulmonary clinic for follow up.   The recent CT scan shows mild patchy air trapping, patchy 'tree and bud' opacity, and mild bronchiectasis, with no fibrotic lung disease or scarring. He experiences no changes in breathing, coughing, fever, or increased shortness of breath. He continues using Breztri , two puffs twice daily, and does not experience shortness of breath during physical activity despite less frequent workouts. He feels fine and reports no significant respiratory symptoms.   Observations/Objective: Middle aged male No distress  Assessment and Plan: Rutherford Alarie is a 48 year old male, never smoker with GERD and severe persistent asthma who returns to pulmonary clinic for follow up.    Severe Persistent Asthma - Continue Breztri , two puffs twice daily. - Report any respiratory exacerbations. - Repeat pulmonary function tests to monitor progression    Follow Up Instructions: Follow up in 9 months   I discussed the assessment and treatment plan with the patient. The patient was provided an opportunity to ask questions and all were answered. The patient agreed with the plan and demonstrated an understanding of the instructions.   The patient was advised to call back or seek an in-person evaluation if the symptoms worsen or if the condition fails to improve as anticipated.  I provided 25 minutes of non-face-to-face  time during this encounter.   Wilfredo Hanly, MD

## 2024-01-07 NOTE — Patient Instructions (Signed)
 Continue breztri  inhaler 2 puffs twice daily  Follow up in 9 months

## 2024-02-12 ENCOUNTER — Other Ambulatory Visit: Payer: Self-pay | Admitting: Medical Genetics

## 2024-02-27 ENCOUNTER — Other Ambulatory Visit

## 2024-05-09 ENCOUNTER — Other Ambulatory Visit: Payer: Self-pay | Admitting: Pulmonary Disease

## 2024-05-09 DIAGNOSIS — J4550 Severe persistent asthma, uncomplicated: Secondary | ICD-10-CM

## 2024-06-03 ENCOUNTER — Other Ambulatory Visit: Payer: Self-pay | Admitting: Medical Genetics

## 2024-06-03 DIAGNOSIS — Z006 Encounter for examination for normal comparison and control in clinical research program: Secondary | ICD-10-CM

## 2024-09-14 ENCOUNTER — Encounter: Admitting: Family Medicine

## 2024-09-23 ENCOUNTER — Encounter: Admitting: Family Medicine
